# Patient Record
Sex: Female | Born: 1955 | Race: White | Hispanic: No | Marital: Married | Smoking: Never smoker
Health system: Southern US, Community
[De-identification: ages and names within clinical notes are randomized; demographics above are authoritative.]

## PROBLEM LIST (undated history)

## (undated) DIAGNOSIS — F32A Depression, unspecified: Secondary | ICD-10-CM

## (undated) DIAGNOSIS — F419 Anxiety disorder, unspecified: Secondary | ICD-10-CM

## (undated) DIAGNOSIS — T7840XA Allergy, unspecified, initial encounter: Secondary | ICD-10-CM

## (undated) DIAGNOSIS — E785 Hyperlipidemia, unspecified: Secondary | ICD-10-CM

## (undated) DIAGNOSIS — K219 Gastro-esophageal reflux disease without esophagitis: Secondary | ICD-10-CM

## (undated) DIAGNOSIS — I10 Essential (primary) hypertension: Secondary | ICD-10-CM

## (undated) HISTORY — DX: Essential (primary) hypertension: I10

## (undated) HISTORY — DX: Hyperlipidemia, unspecified: E78.5

## (undated) HISTORY — DX: Allergy, unspecified, initial encounter: T78.40XA

## (undated) HISTORY — DX: Gastro-esophageal reflux disease without esophagitis: K21.9

## (undated) HISTORY — DX: Depression, unspecified: F32.A

## (undated) HISTORY — DX: Anxiety disorder, unspecified: F41.9

## (undated) HISTORY — PX: OTHER SURGICAL HISTORY: SHX169

---

## 2001-06-09 ENCOUNTER — Other Ambulatory Visit: Admission: RE | Admit: 2001-06-09 | Discharge: 2001-06-09 | Payer: Self-pay | Admitting: General Surgery

## 2001-06-10 ENCOUNTER — Encounter: Payer: Self-pay | Admitting: General Surgery

## 2001-06-10 ENCOUNTER — Ambulatory Visit (HOSPITAL_COMMUNITY): Admission: RE | Admit: 2001-06-10 | Discharge: 2001-06-10 | Payer: Self-pay | Admitting: General Surgery

## 2002-09-17 ENCOUNTER — Encounter: Payer: Self-pay | Admitting: General Surgery

## 2002-09-17 ENCOUNTER — Ambulatory Visit (HOSPITAL_COMMUNITY): Admission: RE | Admit: 2002-09-17 | Discharge: 2002-09-17 | Payer: Self-pay | Admitting: General Surgery

## 2005-04-10 ENCOUNTER — Ambulatory Visit (HOSPITAL_COMMUNITY): Admission: RE | Admit: 2005-04-10 | Discharge: 2005-04-10 | Payer: Self-pay | Admitting: Family Medicine

## 2007-09-10 ENCOUNTER — Ambulatory Visit (HOSPITAL_COMMUNITY): Admission: RE | Admit: 2007-09-10 | Discharge: 2007-09-10 | Payer: Self-pay | Admitting: Family Medicine

## 2008-07-12 ENCOUNTER — Ambulatory Visit (HOSPITAL_COMMUNITY): Admission: RE | Admit: 2008-07-12 | Discharge: 2008-07-12 | Payer: Self-pay | Admitting: Family Medicine

## 2009-03-01 ENCOUNTER — Encounter: Admission: RE | Admit: 2009-03-01 | Discharge: 2009-03-01 | Payer: Self-pay | Admitting: Obstetrics and Gynecology

## 2009-06-15 ENCOUNTER — Ambulatory Visit (HOSPITAL_BASED_OUTPATIENT_CLINIC_OR_DEPARTMENT_OTHER): Admission: RE | Admit: 2009-06-15 | Discharge: 2009-06-15 | Payer: Self-pay | Admitting: Orthopedic Surgery

## 2010-03-19 ENCOUNTER — Encounter: Payer: Self-pay | Admitting: Sports Medicine

## 2010-03-19 ENCOUNTER — Encounter: Payer: Self-pay | Admitting: Family Medicine

## 2010-05-15 LAB — POCT HEMOGLOBIN-HEMACUE: Hemoglobin: 14 g/dL (ref 12.0–15.0)

## 2013-03-09 ENCOUNTER — Encounter: Payer: Self-pay | Admitting: *Deleted

## 2013-03-31 ENCOUNTER — Ambulatory Visit: Payer: Self-pay | Admitting: General Surgery

## 2013-04-06 ENCOUNTER — Telehealth: Payer: Self-pay

## 2013-04-06 NOTE — Telephone Encounter (Signed)
Gastroenterology Pre-Procedure Review  Request Date:  Requesting Physician:   PATIENT REVIEW QUESTIONS: The patient responded to the following health history questions as indicated:    1. Diabetes Melitis: No   2. Joint replacements in the past 12 months: No 3. Major health problems in the past 3 months: No 4. Has an artificial valve or MVP: No 5. Has a defibrillator: No 6. Has been advised in past to take antibiotics in advance of a procedure like teeth cleaning: No 7.Family History Colon Cancer: No 8.Smoking/Alcohol: No    MEDICATIONS & ALLERGIES:    Patient reports the following regarding taking any blood thinners:   Plavix? No Aspirin? No Coumadin? No  Patient confirms/reports the following medications:  Current Outpatient Prescriptions  Medication Sig Dispense Refill  . cetirizine (ZYRTEC) 5 MG tablet Take 5 mg by mouth daily as needed for allergies.      . naproxen sodium (ANAPROX) 220 MG tablet Take 220 mg by mouth as needed.      Marland Kitchen. omeprazole (PRILOSEC OTC) 20 MG tablet Take 20 mg by mouth daily.       No current facility-administered medications for this visit.    Patient confirms/reports the following allergies:  Allergies  Allergen Reactions  . Tape Rash    Needs to use paper tape    No orders of the defined types were placed in this encounter.    AUTHORIZATION INFORMATION Primary Insurance[facility] [place],  [place] #: [id number],  Group #:[id number] Pre-Cert / Berkley HarveyAuth required:  Pre-Cert / Auth #:    SCHEDULE INFORMATION: Procedure has been scheduled as follows:  Date: , Time:  Location:   This Gastroenterology Pre-Precedure Review Form is being routed to the following provider(s):

## 2013-04-08 ENCOUNTER — Encounter: Payer: Self-pay | Admitting: *Deleted

## 2013-04-08 NOTE — Telephone Encounter (Signed)
LMOM for pt to call to get appt for the colonoscopy.

## 2013-04-15 ENCOUNTER — Encounter (HOSPITAL_COMMUNITY): Payer: Self-pay | Admitting: Pharmacy Technician

## 2013-04-15 ENCOUNTER — Other Ambulatory Visit: Payer: Self-pay

## 2013-04-15 DIAGNOSIS — Z1211 Encounter for screening for malignant neoplasm of colon: Secondary | ICD-10-CM

## 2013-04-15 NOTE — Telephone Encounter (Signed)
I called pt and she is scheduled for colonoscopy with Dr. Darrick PennaFields on 04/30/2013 at 1:15 PM. She uses [facility] in [place] on [address]

## 2013-04-15 NOTE — Telephone Encounter (Signed)
PT MAY HAVE A FULL LIQUID DIET THE DAY BEFORE. PREPOPIK-DRINK WATER TO KEEP URINE LIGHT YELLOW.  PT SHOULD DROP OFF RX 3 DAYS PRIOR TO PROCEDURE.

## 2013-04-16 MED ORDER — SOD PICOSULFATE-MAG OX-CIT ACD 10-3.5-12 MG-GM-GM PO PACK: 1.0000 | PACK | ORAL | Status: DC

## 2013-04-16 NOTE — Telephone Encounter (Signed)
Rx sent to the pharmacy and instructions mailed to pt.  

## 2013-04-19 ENCOUNTER — Telehealth: Payer: Self-pay

## 2013-04-19 NOTE — Telephone Encounter (Signed)
Pt called to reschedule her colonoscopy. She is now rescheduled for 05/03/2013 at 10:45 AM and will be at the hospital at 9:45 AM.  She said she previously had gotten a sample prep from another office in [place], and decided not to have procedure done there The date is good through sometime in 2016. She will call me tomorrow with the name of it so I can send her new instructions, the prep we sent was too expensive.   Beth BattenKim is aware of the new appt.

## 2013-04-23 NOTE — Telephone Encounter (Signed)
Called and reminded pt to send me the name of the prep that she has so I can send her instructions. She is at work and said she will do so on Mon.

## 2013-04-26 NOTE — Telephone Encounter (Signed)
REVIEWED.  

## 2013-04-26 NOTE — Telephone Encounter (Signed)
Pt left VM that she has the Su prep. Just needs instructions for it.

## 2013-04-26 NOTE — Telephone Encounter (Signed)
Instructions for the Suprep mailed to pt.

## 2013-04-27 ENCOUNTER — Ambulatory Visit: Payer: Self-pay

## 2013-04-27 NOTE — Telephone Encounter (Signed)
Pt called to reschedule her colonoscopy that is scheduled for 05/03/2013. 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   She left me some dates on VM and I have rescheduled her for 05/31/2013 at 8:30 AM with Dr. Darrick PennaFields and Selena BattenKim is aware.   Pt is aware I will mail her new instructions with date and time.

## 2013-04-29 NOTE — Telephone Encounter (Signed)
INSTRUCT PT TO TAKE FIRST DOSE AT 530PM ON DAY BEFORE TCS. FOLLOWED BY 32 OZ OF LIQUID. TAKE SECOND DOSE AT 11PM. FOLLOWED BY 32 OZ OF FLUID.

## 2013-05-04 NOTE — Telephone Encounter (Signed)
New instructions mailed to pt with new date and time for colonoscopy.

## 2013-05-17 NOTE — Telephone Encounter (Signed)
Pt called and rescheduled her colonoscopy for 05/31/2013 to 06/21/2013 at 8:30 AM with Dr. Darrick PennaFields. I called Kim and LMOM and asked her to call and confirmed that she received the change.

## 2013-06-18 ENCOUNTER — Telehealth: Payer: Self-pay

## 2013-06-18 NOTE — Telephone Encounter (Signed)
Pt called to cancel her TCS of Monday with [facility]. She will call back to reschedule sometime in June

## 2013-06-21 ENCOUNTER — Ambulatory Visit (HOSPITAL_COMMUNITY)
Admission: RE | Admit: 2013-06-21 | Payer: BC Managed Care – PPO | Source: Ambulatory Visit | Admitting: Gastroenterology

## 2013-06-21 ENCOUNTER — Encounter (HOSPITAL_COMMUNITY): Admission: RE | Payer: Self-pay | Source: Ambulatory Visit

## 2013-06-21 SURGERY — COLONOSCOPY
Anesthesia: Moderate Sedation

## 2014-09-16 IMAGING — US US BREAST*L* LIMITED INC AXILLA
1 series · 3 of 3 positions shown · non-contrast
Comparison: Prior mammograms 10/17/2010, 03/01/2009 and prior
bilateral breast ultrasound 10/17/2010 [REDACTED].

CLINICAL DATA: Prior outside benign biopsy of the left breast,
known bilateral breast cysts, presenting for follow-up after the
biopsy. Patient had an outside ultrasound in September 2010,
describing a 2 mm shadowing mass in the left breast, and this is
presumably what was biopsied.

EXAM:
DIGITAL DIAGNOSTIC  BILATERAL MAMMOGRAM WITH CAD
ULTRASOUND LEFT BREAST

[Series 1: us breast*left* limited inc axilla · 0.08mm/px · 3 of 3 slices shown]
[im 1/3]
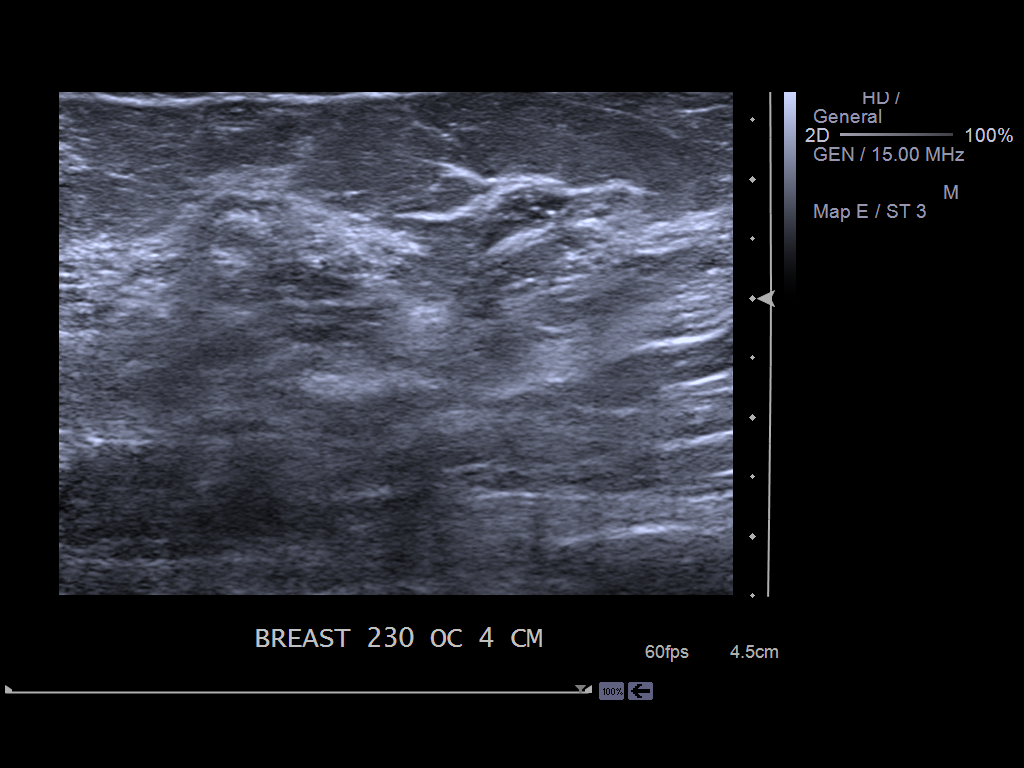
[im 2/3]
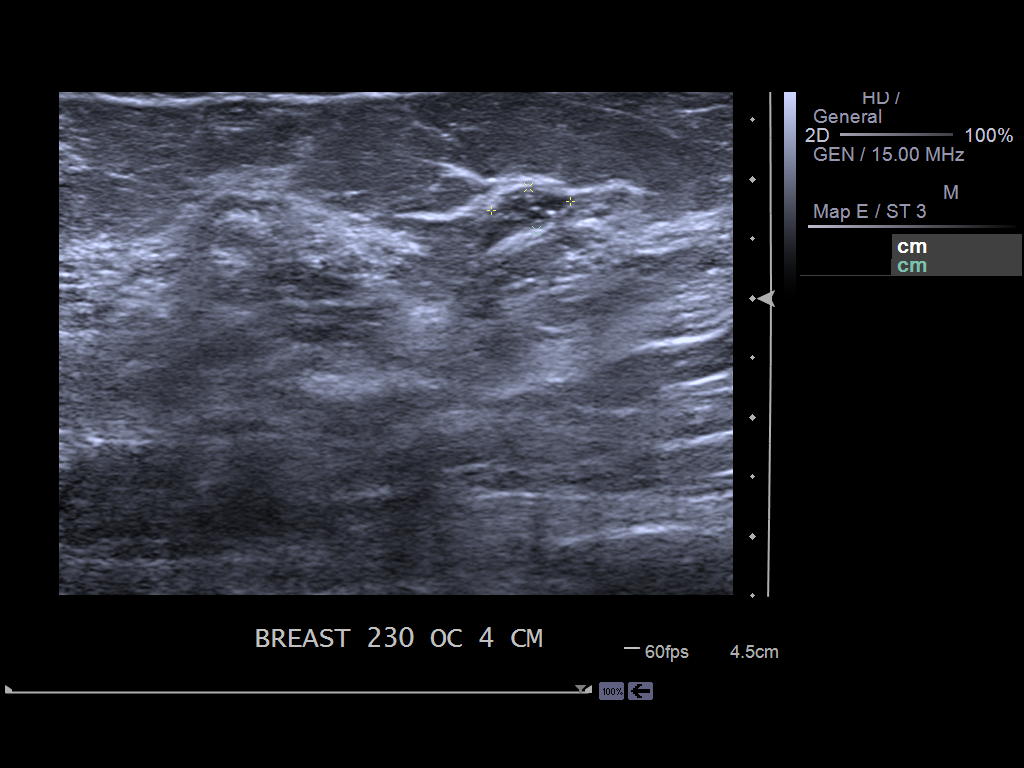
[im 3/3]
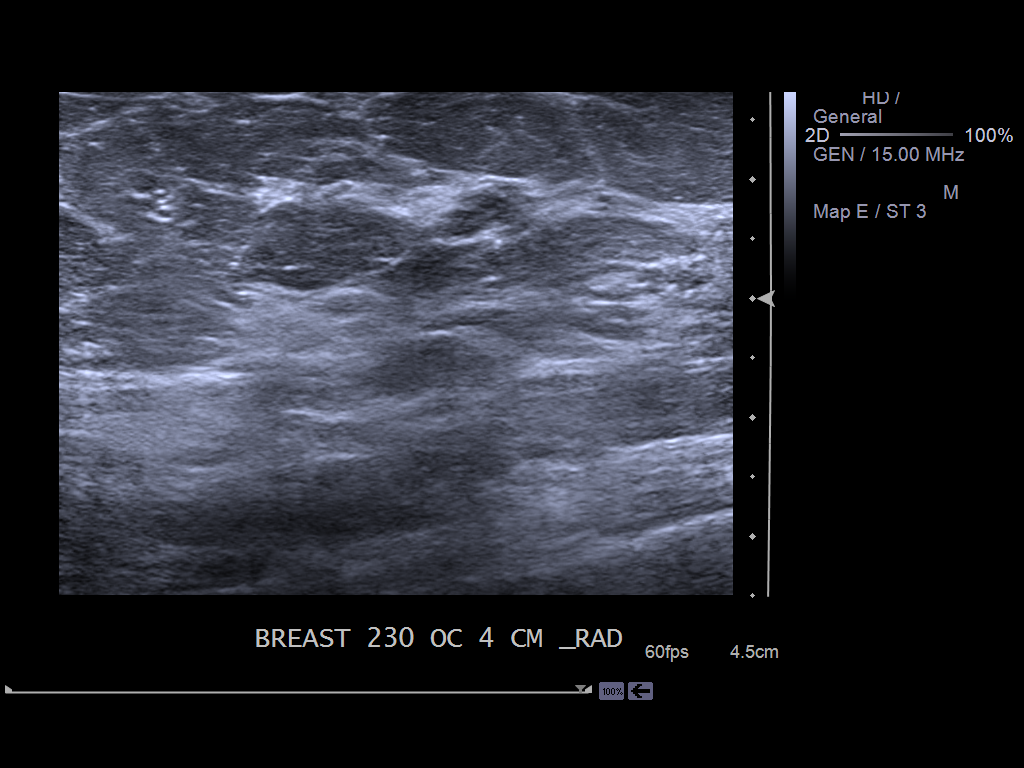

[3 of 3 positions shown; findings below may reference images not displayed]

ACR Breast Density Category c: The breast tissue is heterogeneously
dense, which may obscure small masses.
FINDINGS: CC and MLO views of both breasts were obtained. Multiple bilateral
circumscribed, partially obscured masses as noted on the prior
examination, shown on the prior ultrasound to represent cysts. No
findings suspicious for malignancy in either breast.

Mammographic images were processed with CAD.

On physical exam, there is no palpable abnormality in the
periareolar left breast.

Ultrasound is performed, showing a cluster of small cysts or focus
of apocrine metaplasia at the 2:30 o'clock position of the left
breast approximately 4 cm from the nipple measuring approximately
0.6 x 0.4 x 0.7 cm. The previously identified possible shadowing 2
mm lesion was not identified. No suspicious solid mass or abnormal
acoustic shadowing was identified.
IMPRESSION: 1. Likely benign focus of apocrine metaplasia or cluster of small
cysts in the outer periareolar left breast.
2. No mammographic or sonographic evidence of malignancy, left
breast.
3. No mammographic evidence of malignancy, right breast.

RECOMMENDATION:
Screening mammogram in one year.(Code:8K-G-NNG)

The importance of self breast examination was discussed with the
patient. I have discussed the findings and recommendations with the
patient. Results were also provided in writing at the conclusion of
the visit. If applicable, a reminder letter will be sent to the
patient regarding the next appointment.

BI-RADS CATEGORY  2: Benign finding(s).

## 2015-07-03 ENCOUNTER — Ambulatory Visit: Payer: Self-pay | Admitting: Unknown Physician Specialty

## 2015-08-15 ENCOUNTER — Encounter: Payer: Self-pay | Admitting: Unknown Physician Specialty

## 2015-08-15 ENCOUNTER — Ambulatory Visit (INDEPENDENT_AMBULATORY_CARE_PROVIDER_SITE_OTHER): Payer: Managed Care, Other (non HMO) | Admitting: Unknown Physician Specialty

## 2015-08-15 VITALS — BP 136/80 | HR 82 | Temp 97.9°F | Ht 59.0 in | Wt 171.8 lb

## 2015-08-15 DIAGNOSIS — M159 Polyosteoarthritis, unspecified: Secondary | ICD-10-CM

## 2015-08-15 DIAGNOSIS — E669 Obesity, unspecified: Secondary | ICD-10-CM | POA: Insufficient documentation

## 2015-08-15 DIAGNOSIS — N952 Postmenopausal atrophic vaginitis: Secondary | ICD-10-CM | POA: Diagnosis not present

## 2015-08-15 DIAGNOSIS — Z0000 Encounter for general adult medical examination without abnormal findings: Secondary | ICD-10-CM

## 2015-08-15 DIAGNOSIS — Z23 Encounter for immunization: Secondary | ICD-10-CM | POA: Diagnosis not present

## 2015-08-15 DIAGNOSIS — J309 Allergic rhinitis, unspecified: Secondary | ICD-10-CM

## 2015-08-15 DIAGNOSIS — E663 Overweight: Secondary | ICD-10-CM | POA: Diagnosis not present

## 2015-08-15 DIAGNOSIS — K219 Gastro-esophageal reflux disease without esophagitis: Secondary | ICD-10-CM

## 2015-08-15 NOTE — Assessment & Plan Note (Addendum)
Discussed diet and exercise.  Would like to start 30 minutes 4 days/week

## 2015-08-15 NOTE — Progress Notes (Signed)
BP 136/80 mmHg  Pulse 82  Temp(Src) 97.9 F (36.6 C)  Ht  (1.499 m)  Wt 171 lb 12.8 oz (77.928 kg)  BMI 34.68 kg/m2  SpO2 99%  LMP  (LMP Unknown)   Subjective:    Patient ID: Beth James, female    DOB: 25-Aug-1955, 60 y.o.   MRN: [id number]  HPI: Beth James is a 60 y.o. female  Chief Complaint  Patient presents with  . Establish Care    pt states she has never had colonoscopy, will get pap with [place] at physical, and wants to wait for tetanus and shingles vaccines   OA Thumbs, shoulders and knee.  Takes 2 Aleve at night  GERD Controlled with Omeprazole  Allergic Rhinitis Eyes water and nose running.  Allegra seems to help  Relevant past medical, surgical, family and social history reviewed and updated as indicated. Interim medical history since our last visit reviewed. Allergies and medications reviewed and updated.  Review of Systems  Constitutional: Negative.   HENT: Negative.   Eyes: Negative.   Respiratory: Negative.   Cardiovascular: Negative.   Gastrointestinal: Negative.   Endocrine: Negative.   Genitourinary: Negative.   Musculoskeletal: Negative.   Skin: Negative.   Allergic/Immunologic: Negative.   Neurological: Negative.   Hematological: Negative.   Psychiatric/Behavioral: Negative.     Per HPI unless specifically indicated above     Objective:    BP 136/80 mmHg  Pulse 82  Temp(Src) 97.9 F (36.6 C)  Ht  (1.499 m)  Wt 171 lb 12.8 oz (77.928 kg)  BMI 34.68 kg/m2  SpO2 99%  LMP  (LMP Unknown)  Wt Readings from Last 3 Encounters:  08/15/15 171 lb 12.8 oz (77.928 kg)    Physical Exam  Constitutional: She is oriented to person, place, and time. She appears well-developed and well-nourished.  HENT:  Head: Normocephalic and atraumatic.  Eyes: Pupils are equal, round, and reactive to light. Right eye exhibits no discharge. Left eye exhibits no discharge. No scleral icterus.  Neck: Normal range of motion. Neck supple.  Carotid bruit is not present. No thyromegaly present.  Cardiovascular: Normal rate, regular rhythm and normal heart sounds.  Exam reveals no gallop and no friction rub.   No murmur heard. Pulmonary/Chest: Effort normal and breath sounds normal. No respiratory distress. She has no wheezes. She has no rales.  Abdominal: Soft. Bowel sounds are normal. There is no tenderness. There is no rebound.  Genitourinary: Vagina normal and uterus normal. No breast swelling, tenderness or discharge. Cervix exhibits no motion tenderness, no discharge and no friability. Right adnexum displays no mass, no tenderness and no fullness. Left adnexum displays no mass, no tenderness and no fullness.  Musculoskeletal: Normal range of motion.  Lymphadenopathy:    She has no cervical adenopathy.  Neurological: She is alert and oriented to person, place, and time.  Skin: Skin is warm, dry and intact. No rash noted.  Psychiatric: She has a normal mood and affect. Her speech is normal and behavior is normal. Judgment and thought content normal. Cognition and memory are normal.    Results for orders placed or performed during the hospital encounter of 06/15/09  Hemoglobin-hemacue, POC  Result Value Ref Range   Hemoglobin 14.0 12.0 - 15.0 g/dL      Assessment & Plan:   Problem List Items Addressed This Visit      Unprioritized   Atrophic vaginitis    Non-hormonal treatment discussed      Gastroesophageal  reflux disease without esophagitis   Generalized OA   Overweight    Discussed diet and exercise.  Would like to start 30 minutes 4 days/week      Rhinitis, allergic    Other Visit Diagnoses    Routine general medical examination at a health care facility    -  Primary    Relevant Orders    Tdap vaccine greater than or equal to 7yo IM (Completed)    MM DIGITAL SCREENING BILATERAL    Comprehensive metabolic panel    CBC with Differential/Platelet    Lipid Panel w/o Chol/HDL Ratio    TSH    IGP, Aptima  HPV, rfx 16/18,45    Cologuard        Follow up plan: Return if symptoms worsen or fail to improve.

## 2015-08-15 NOTE — Patient Instructions (Addendum)
Vaginal dryness Replense/with coconut oil Astroglide  Please do call to schedule your mammogram; the number to schedule one at either [facility] or [facility] is [phone]   Tdap Vaccine (Tetanus, Diphtheria and Pertussis): What You Need to Know 1. Why get vaccinated? Tetanus, diphtheria and pertussis are very serious diseases. Tdap vaccine can protect us from these diseases. And, Tdap vaccine given to pregnant women can protect newborn babies against pertussis. TETANUS (Lockjaw) is rare in the [place] today. It causes painful muscle tightening and stiffness, usually all over the body.  It can lead to tightening of muscles in the head and neck so you can't open your mouth, swallow, or sometimes even breathe. Tetanus kills about 1 out of 10 people who are infected even after receiving the best medical care. DIPHTHERIA is also rare in the [place] today. It can cause a thick coating to form in the back of the throat.  It can lead to breathing problems, heart failure, paralysis, and death. PERTUSSIS (Whooping Cough) causes severe coughing spells, which can cause difficulty breathing, vomiting and disturbed sleep.  It can also lead to weight loss, incontinence, and rib fractures. Up to 2 in 100 adolescents and 5 in 100 adults with pertussis are hospitalized or have complications, which could include pneumonia or death. These diseases are caused by bacteria. Diphtheria and pertussis are spread from person to person through secretions from coughing or sneezing. Tetanus enters the body through cuts, scratches, or wounds. Before vaccines, as many as 200,000 cases of diphtheria, 200,000 cases of pertussis, and hundreds of cases of tetanus, were reported in the [place] each year. Since vaccination began, reports of cases for tetanus and diphtheria have dropped by about 99% and for pertussis by about 80%. 2. Tdap vaccine Tdap vaccine can protect  adolescents and adults from tetanus, diphtheria, and pertussis. One dose of Tdap is routinely given at age 60 or 5912. People who did not get Tdap at that age should get it as soon as possible. Tdap is especially important for healthcare professionals and anyone having close contact with a baby younger than 12 months. Pregnant women should get a dose of Tdap during every pregnancy, to protect the newborn from pertussis. Infants are most at risk for severe, life-threatening complications from pertussis. Another vaccine, called Td, protects against tetanus and diphtheria, but not pertussis. A Td booster should be given every 10 years. Tdap may be given as one of these boosters if you have never gotten Tdap before. Tdap may also be given after a severe cut or burn to prevent tetanus infection. Your doctor or the person giving you the vaccine can give you more information. Tdap may safely be given at the same time as other vaccines. 3. Some people should not get this vaccine  A person who has ever had a life-threatening allergic reaction after a previous dose of any diphtheria, tetanus or pertussis containing vaccine, OR has a severe allergy to any part of this vaccine, should not get Tdap vaccine. Tell the person giving the vaccine about any severe allergies.  Anyone who had coma or long repeated seizures within 7 days after a childhood dose of DTP or DTaP, or a previous dose of Tdap, should not get Tdap, unless a cause other than the vaccine was found. They can still get Td.  Talk to your doctor if you:  have seizures or another nervous system problem,  had severe pain or swelling after any vaccine containing  diphtheria, tetanus or pertussis,  ever had a condition called Guillain-Barr Syndrome (GBS),  aren't feeling well on the day the shot is scheduled. 4. Risks With any medicine, including vaccines, there is a chance of side effects. These are usually mild and go away on their own. Serious  reactions are also possible but are rare. Most people who get Tdap vaccine do not have any problems with it. Mild problems following Tdap (Did not interfere with activities)  Pain where the shot was given (about 3 in 4 adolescents or 2 in 3 adults)  Redness or swelling where the shot was given (about 1 person in 5)  Mild fever of at least 100.93F (up to about 1 in 25 adolescents or 1 in 100 adults)  Headache (about 3 or 4 people in 10)  Tiredness (about 1 person in 3 or 4)  Nausea, vomiting, diarrhea, stomach ache (up to 1 in 4 adolescents or 1 in 10 adults)  Chills, sore joints (about 1 person in 10)  Body aches (about 1 person in 3 or 4)  Rash, swollen glands (uncommon) Moderate problems following Tdap (Interfered with activities, but did not require medical attention)  Pain where the shot was given (up to 1 in 5 or 6)  Redness or swelling where the shot was given (up to about 1 in 16 adolescents or 1 in 12 adults)  Fever over 102F (about 1 in 100 adolescents or 1 in 250 adults)  Headache (about 1 in 7 adolescents or 1 in 10 adults)  Nausea, vomiting, diarrhea, stomach ache (up to 1 or 3 people in 100)  Swelling of the entire arm where the shot was given (up to about 1 in 500). Severe problems following Tdap (Unable to perform usual activities; required medical attention)  Swelling, severe pain, bleeding and redness in the arm where the shot was given (rare). Problems that could happen after any vaccine:  People sometimes faint after a medical procedure, including vaccination. Sitting or lying down for about 15 minutes can help prevent fainting, and injuries caused by a fall. Tell your doctor if you feel dizzy, or have vision changes or ringing in the ears.  Some people get severe pain in the shoulder and have difficulty moving the arm where a shot was given. This happens very rarely.  Any medication can cause a severe allergic reaction. Such reactions from a vaccine  are very rare, estimated at fewer than 1 in a million doses, and would happen within a few minutes to a few hours after the vaccination. As with any medicine, there is a very remote chance of a vaccine causing a serious injury or death. The safety of vaccines is always being monitored. For more information, visit: http://floyd.org/ 5. What if there is a serious problem? What should I look for?  Look for anything that concerns you, such as signs of a severe allergic reaction, very high fever, or unusual behavior.  Signs of a severe allergic reaction can include hives, swelling of the face and throat, difficulty breathing, a fast heartbeat, dizziness, and weakness. These would usually start a few minutes to a few hours after the vaccination. What should I do?  If you think it is a severe allergic reaction or other emergency that can't wait, call 9-1-1 or get the person to the nearest hospital. Otherwise, call your doctor.  Afterward, the reaction should be reported to the Vaccine Adverse Event Reporting System (VAERS). Your doctor might file this report, or you can do  it yourself through the VAERS web site at www.vaers.LAgents.no, or by calling 1-931-509-6414. VAERS does not give medical advice.  6. The National Vaccine Injury Compensation Program The [employer] Vaccine Injury Compensation Program (VICP) is a federal program that was created to compensate people who may have been injured by certain vaccines. Persons who believe they may have been injured by a vaccine can learn about the program and about filing a claim by calling 1-(661) 557-7590 or visiting the VICP website at SpiritualWord.at. There is a time limit to file a claim for compensation. 7. How can I learn more?  Ask your doctor. He or she can give you the vaccine package insert or suggest other sources of information.  Call your local or state health department.  Contact the Centers for Disease Control and  Prevention (CDC):  Call 856 413 8794 (1-800-CDC-INFO) or  Visit CDC's website at PicCapture.uy CDC Tdap Vaccine VIS (04/20/13)   This information is not intended to replace advice given to you by your health care provider. Make sure you discuss any questions you have with your health care provider.   Document Released: 08/13/2011 Document Revised: 03/04/2014 Document Reviewed: 05/26/2013 Elsevier Interactive Patient Education [employer]

## 2015-08-15 NOTE — Assessment & Plan Note (Signed)
Non-hormonal treatment discussed

## 2015-08-16 ENCOUNTER — Encounter: Payer: Self-pay | Admitting: Unknown Physician Specialty

## 2015-08-16 LAB — CBC WITH DIFFERENTIAL/PLATELET
BASOS ABS: 0 10*3/uL (ref 0.0–0.2)
Basos: 1 %
EOS (ABSOLUTE): 0.1 10*3/uL (ref 0.0–0.4)
Eos: 2 %
HEMOGLOBIN: 13.6 g/dL (ref 11.1–15.9)
Hematocrit: 43 % (ref 34.0–46.6)
Immature Grans (Abs): 0 10*3/uL (ref 0.0–0.1)
Immature Granulocytes: 0 %
LYMPHS ABS: 2.7 10*3/uL (ref 0.7–3.1)
Lymphs: 37 %
MCH: 28.5 pg (ref 26.6–33.0)
MCHC: 31.6 g/dL (ref 31.5–35.7)
MCV: 90 fL (ref 79–97)
MONOCYTES: 8 %
MONOS ABS: 0.6 10*3/uL (ref 0.1–0.9)
NEUTROS ABS: 3.9 10*3/uL (ref 1.4–7.0)
Neutrophils: 52 %
Platelets: 365 10*3/uL (ref 150–379)
RBC: 4.78 x10E6/uL (ref 3.77–5.28)
RDW: 13.7 % (ref 12.3–15.4)
WBC: 7.3 10*3/uL (ref 3.4–10.8)

## 2015-08-16 LAB — COMPREHENSIVE METABOLIC PANEL
ALT: 12 IU/L (ref 0–32)
AST: 11 IU/L (ref 0–40)
Albumin/Globulin Ratio: 1.9 (ref 1.2–2.2)
Albumin: 4.5 g/dL (ref 3.6–4.8)
Alkaline Phosphatase: 90 IU/L (ref 39–117)
BUN/Creatinine Ratio: 26 (ref 12–28)
BUN: 18 mg/dL (ref 8–27)
Bilirubin Total: 0.2 mg/dL (ref 0.0–1.2)
CO2: 24 mmol/L (ref 18–29)
Calcium: 9.6 mg/dL (ref 8.7–10.3)
Chloride: 103 mmol/L (ref 96–106)
Creatinine, Ser: 0.68 mg/dL (ref 0.57–1.00)
GFR calc Af Amer: 110 mL/min/{1.73_m2} (ref >59)
GFR calc non Af Amer: 95 mL/min/{1.73_m2} (ref >59)
Globulin, Total: 2.4 g/dL (ref 1.5–4.5)
Glucose: 102 mg/dL — ABNORMAL HIGH (ref 65–99)
Potassium: 4.8 mmol/L (ref 3.5–5.2)
Sodium: 144 mmol/L (ref 134–144)
Total Protein: 6.9 g/dL (ref 6.0–8.5)

## 2015-08-16 LAB — LIPID PANEL W/O CHOL/HDL RATIO
CHOLESTEROL TOTAL: 218 mg/dL — AB (ref 100–199)
HDL: 47 mg/dL (ref >39)
LDL CALC: 121 mg/dL — AB (ref 0–99)
Triglycerides: 252 mg/dL — ABNORMAL HIGH (ref 0–149)
VLDL Cholesterol Cal: 50 mg/dL — ABNORMAL HIGH (ref 5–40)

## 2015-08-16 LAB — TSH: TSH: 1.88 u[IU]/mL (ref 0.450–4.500)

## 2015-08-17 LAB — IGP, APTIMA HPV, RFX 16/18,45
HPV Aptima: NEGATIVE
PAP SMEAR COMMENT: 0

## 2016-04-17 ENCOUNTER — Ambulatory Visit (INDEPENDENT_AMBULATORY_CARE_PROVIDER_SITE_OTHER): Payer: Managed Care, Other (non HMO) | Admitting: Family Medicine

## 2016-04-17 ENCOUNTER — Encounter: Payer: Self-pay | Admitting: Family Medicine

## 2016-04-17 VITALS — BP 122/64 | HR 106 | Temp 97.8°F | Wt 173.9 lb

## 2016-04-17 DIAGNOSIS — J029 Acute pharyngitis, unspecified: Secondary | ICD-10-CM

## 2016-04-17 DIAGNOSIS — M25512 Pain in left shoulder: Secondary | ICD-10-CM

## 2016-04-17 DIAGNOSIS — M25511 Pain in right shoulder: Secondary | ICD-10-CM

## 2016-04-17 MED ORDER — CYCLOBENZAPRINE HCL 10 MG PO TABS: 10.0000 mg | ORAL_TABLET | Freq: Every day | ORAL | 0 refills | Status: DC

## 2016-04-17 MED ORDER — PREDNISONE 20 MG PO TABS: 40.0000 mg | ORAL_TABLET | Freq: Every day | ORAL | 0 refills | Status: DC

## 2016-04-17 NOTE — Progress Notes (Signed)
BP 122/64 (BP Location: Left Arm, Patient Position: Sitting, Cuff Size: Normal)   Pulse (!) 106   Temp 97.8 F (36.6 C)   Wt 173 lb 14.4 oz (78.9 kg)   LMP  (LMP Unknown)   SpO2 97%   BMI 35.12 kg/m    Subjective:    Patient ID: Beth James, female    DOB: 05/15/1955, 61 y.o.   MRN: [id number]  HPI: Beth Carryamela L Barkey is a 61 y.o. female  Chief Complaint  Patient presents with  . Shoulder Pain    Bilateral. Duration a couple of months.   . Throat Problem    Patient feels like she has something stuck in the back of her throat.    Patient with several months of b/l shoulder soreness. Some decreased ROM in left shoulder. Denies fever, HA, numbness, tingling, weakness. No known injury, but is a side sleeper. Hx of OA. Trying tylenol with no relief.   Also c/o feeling like something is swollen in the back of her throat for a week or two. Recently recovered from sinus issues and this is the only lingering symptom. Wondering if it's from post-nasal drip.   No past medical history on file.  Social History   Social History  . Marital status: Married    Spouse name: N/A  . Number of children: N/A  . Years of education: N/A   Occupational History  . Not on file.   Social History Main Topics  . Smoking status: Never Smoker  . Smokeless tobacco: Never Used  . Alcohol use No  . Drug use: No  . Sexual activity: Yes   Other Topics Concern  . Not on file   Social History Narrative  . No narrative on file    Relevant past medical, surgical, family and social history reviewed and updated as indicated. Interim medical history since our last visit reviewed. Allergies and medications reviewed and updated.  Review of Systems  Constitutional: Negative for chills and fever.  HENT: Positive for sore throat. Negative for trouble swallowing.   Eyes: Negative.   Respiratory: Negative.   Cardiovascular: Negative.   Gastrointestinal: Negative.   Genitourinary: Negative.     Musculoskeletal: Positive for arthralgias and myalgias.  Neurological: Negative.   Psychiatric/Behavioral: Negative.     Per HPI unless specifically indicated above     Objective:    BP 122/64 (BP Location: Left Arm, Patient Position: Sitting, Cuff Size: Normal)   Pulse (!) 106   Temp 97.8 F (36.6 C)   Wt 173 lb 14.4 oz (78.9 kg)   LMP  (LMP Unknown)   SpO2 97%   BMI 35.12 kg/m   Wt Readings from Last 3 Encounters:  04/17/16 173 lb 14.4 oz (78.9 kg)  08/15/15 171 lb 12.8 oz (77.9 kg)    Physical Exam  Constitutional: She is oriented to person, place, and time. She appears well-developed and well-nourished. No distress.  HENT:  Head: Atraumatic.  Right Ear: External ear normal.  Left Ear: External ear normal.  Nose: Nose normal.  Posterior oropharynx erythematous  Eyes: Conjunctivae are normal. Pupils are equal, round, and reactive to light.  Neck: Normal range of motion. Neck supple.  Cardiovascular: Normal rate and normal heart sounds.   Pulmonary/Chest: Effort normal and breath sounds normal. No respiratory distress.  Musculoskeletal: She exhibits tenderness (Mild ttp over deltoids b/l). She exhibits no edema or deformity.  Good ROM in b/l shoulders. Some crepitus b/l. Pain with overhead extension of left shoulder  Lymphadenopathy:    She has cervical adenopathy (mild).  Neurological: She is alert and oriented to person, place, and time.  Skin: Skin is warm and dry. No rash noted. No erythema.  Psychiatric: She has a normal mood and affect. Her behavior is normal.  Nursing note and vitals reviewed.     Assessment & Plan:   Problem List Items Addressed This Visit    None    Visit Diagnoses    Acute pain of both shoulders    -  Primary   Pt declines labs today, would like to try prednisone and flexeril QHS first. Discussed gentle stretches, strengthening exercises, tylenol prn.    Throat soreness       Pt declines throat culture, will continue prilosec and  increase flonase to twice daily. Monitor closely and f/u with worsening sxs or no improvement       Follow up plan: Return for Physical Exam.

## 2016-04-19 NOTE — Patient Instructions (Signed)
Follow up as needed

## 2016-09-03 ENCOUNTER — Ambulatory Visit (INDEPENDENT_AMBULATORY_CARE_PROVIDER_SITE_OTHER): Payer: Managed Care, Other (non HMO) | Admitting: Family Medicine

## 2016-09-03 ENCOUNTER — Encounter: Payer: Self-pay | Admitting: Family Medicine

## 2016-09-03 VITALS — BP 138/80 | HR 85 | Temp 98.9°F | Ht 58.75 in | Wt 174.0 lb

## 2016-09-03 DIAGNOSIS — Z1231 Encounter for screening mammogram for malignant neoplasm of breast: Secondary | ICD-10-CM | POA: Diagnosis not present

## 2016-09-03 DIAGNOSIS — Z0000 Encounter for general adult medical examination without abnormal findings: Secondary | ICD-10-CM | POA: Diagnosis not present

## 2016-09-03 DIAGNOSIS — Z1211 Encounter for screening for malignant neoplasm of colon: Secondary | ICD-10-CM

## 2016-09-03 DIAGNOSIS — M25512 Pain in left shoulder: Secondary | ICD-10-CM

## 2016-09-03 DIAGNOSIS — G8929 Other chronic pain: Secondary | ICD-10-CM

## 2016-09-03 LAB — UA/M W/RFLX CULTURE, ROUTINE
Bilirubin, UA: NEGATIVE
Glucose, UA: NEGATIVE
KETONES UA: NEGATIVE
NITRITE UA: NEGATIVE
PH UA: 6.5 (ref 5.0–7.5)
Protein, UA: NEGATIVE
SPEC GRAV UA: 1.02 (ref 1.005–1.030)
Urobilinogen, Ur: 0.2 mg/dL (ref 0.2–1.0)

## 2016-09-03 LAB — MICROSCOPIC EXAMINATION
BACTERIA UA: NONE SEEN
RBC MICROSCOPIC, UA: NONE SEEN /HPF (ref 0–?)

## 2016-09-03 NOTE — Progress Notes (Signed)
BP 138/80   Pulse 85   Temp 98.9 F (37.2 C)   Ht 4' 10.75" (1.492 m)   Wt 174 lb (78.9 kg)   LMP  (LMP Unknown)   SpO2 98%   BMI 35.44 kg/m    Subjective:    Patient ID: Beth James, female    DOB: 1955/06/30, 61 y.o.   MRN: [id number]  HPI: Beth James is a 61 y.o. female presenting on 09/03/2016 for comprehensive medical examination. Current medical complaints include:B/l shoulder pain much improved after prednisone and flexeril course back in Feb, but still experiencing decreased ROM in anterior left shoulder. Feels issue is actually slowly worsening despite efforts to do stretches and exercises. Hx of OA, and has had surgical removal of bone spurs on right shoulder in the past.   Patient is not fasting today for labs.   Menopausal Symptoms: no  Depression Screen done today and results listed below:  Depression screen [facility] 2/9 09/03/2016 08/15/2015  Decreased Interest 0 0  Down, Depressed, Hopeless 0 0  PHQ - 2 Score 0 0    The patient does not have a history of falls. I did not complete a risk assessment for falls. A plan of care for falls was not documented.   Past Medical History:  History reviewed. No pertinent past medical history.  Surgical History:  Past Surgical History:  Procedure Laterality Date  . bone spur removal     left shoulder  . cartilage removal     left knee  . Clogged Tear Duct Stint placed      Medications:  Current Outpatient Prescriptions on File Prior to Visit  Medication Sig  . CALCIUM PO Take 2 tablets by mouth daily.  . fluticasone (FLONASE) 50 MCG/ACT nasal spray Place into both nostrils daily.  . naproxen sodium (ALEVE) 220 MG tablet Take 440 mg by mouth at bedtime.  Marland Kitchen omeprazole (PRILOSEC OTC) 20 MG tablet Take 20 mg by mouth daily.   No current facility-administered medications on file prior to visit.     Allergies:  Allergies  Allergen Reactions  . Codeine Other (See Comments)    jittery  . Tape Rash    Needs  to use paper tape    Social History:  Social History   Social History  . Marital status: Married    Spouse name: N/A  . Number of children: N/A  . Years of education: N/A   Occupational History  . Not on file.   Social History Main Topics  . Smoking status: Never Smoker  . Smokeless tobacco: Never Used  . Alcohol use No  . Drug use: No  . Sexual activity: Yes   Other Topics Concern  . Not on file   Social History Narrative  . No narrative on file   History  Smoking Status  . Never Smoker  Smokeless Tobacco  . Never Used   History  Alcohol Use No    Family History:  Family History  Problem Relation Age of Onset  . Heart disease Mother        CHF  . Heart disease Father   . Cancer Father        lung  . Heart disease Maternal Grandmother        CHF  . Cancer Other        colon   Past medical history, surgical history, medications, allergies, family history and social history reviewed with patient today and changes made to appropriate areas  of the chart.   Review of Systems - General ROS: negative Psychological ROS: negative Ophthalmic ROS: negative ENT ROS: negative Allergy and Immunology ROS: negative Endocrine ROS: negative Breast ROS: negative for breast lumps Respiratory ROS: no cough, shortness of breath, or wheezing Cardiovascular ROS: no chest pain or dyspnea on exertion Gastrointestinal ROS: no abdominal pain, change in bowel habits, or black or bloody stools Genito-Urinary ROS: no dysuria, trouble voiding, or hematuria Musculoskeletal ROS: negative Neurological ROS: no TIA or stroke symptoms Dermatological ROS: negative All other ROS negative except what is listed above and in the HPI.      Objective:    BP 138/80   Pulse 85   Temp 98.9 F (37.2 C)   Ht 4' 10.75" (1.492 m)   Wt 174 lb (78.9 kg)   LMP  (LMP Unknown)   SpO2 98%   BMI 35.44 kg/m   Wt Readings from Last 3 Encounters:  09/03/16 174 lb (78.9 kg)  04/17/16 173 lb 14.4  oz (78.9 kg)  08/15/15 171 lb 12.8 oz (77.9 kg)    Physical Exam  Constitutional: She is oriented to person, place, and time. She appears well-developed and well-nourished. No distress.  HENT:  Head: Atraumatic.  Right Ear: External ear normal.  Left Ear: External ear normal.  Nose: Nose normal.  Mouth/Throat: Oropharynx is clear and moist. No oropharyngeal exudate.  Eyes: Conjunctivae are normal. Pupils are equal, round, and reactive to light. No scleral icterus.  Neck: Normal range of motion. Neck supple. No thyromegaly present.  Cardiovascular: Normal rate, regular rhythm, normal heart sounds and intact distal pulses.   Pulmonary/Chest: Effort normal and breath sounds normal. No respiratory distress. Right breast exhibits no mass, no skin change and no tenderness. Left breast exhibits no mass, no skin change and no tenderness.  Abdominal: Soft. Bowel sounds are normal. She exhibits no mass. There is no tenderness.  Musculoskeletal: Normal range of motion. She exhibits no edema or tenderness.  Lymphadenopathy:    She has no cervical adenopathy.    She has no axillary adenopathy.  Neurological: She is alert and oriented to person, place, and time. No cranial nerve deficit.  Skin: Skin is warm and dry. No rash noted.  Psychiatric: She has a normal mood and affect. Her behavior is normal.  Nursing note and vitals reviewed.     Assessment & Plan:   Problem List Items Addressed This Visit    None    Visit Diagnoses    Annual physical exam    -  Primary   Non-fasting labs drawn today. Cologuard and mammogram ordered. Await results   Relevant Orders   CBC with Differential/Platelet   Comprehensive metabolic panel   Lipid Panel w/o Chol/HDL Ratio   TSH   UA/M w/rflx Culture, Routine   HgB A1c   Screening mammogram, encounter for       Relevant Orders   MM Digital Screening   Screening for colon cancer       Relevant Orders   Cologuard   Chronic left shoulder pain       Will  obtain x-ray. Discussed continued ROM exercises and gentle stretches, flexeril prn. May need further eval with orthopedics    Relevant Orders   DG Shoulder Left      Follow up plan: Return in about 1 year (around 09/03/2017) for CPE.  LABORATORY TESTING:  - Pap smear: up to date  IMMUNIZATIONS:   - Tdap: Tetanus vaccination status reviewed: last tetanus booster within  10 years. - Influenza: Postponed to flu season  SCREENING: -Mammogram: Ordered today  - Colonoscopy: cologuard ordered   PATIENT COUNSELING:   Advised to take 1 mg of folate supplement per day if capable of pregnancy.   Sexuality: Discussed sexually transmitted diseases, partner selection, use of condoms, avoidance of unintended pregnancy  and contraceptive alternatives.   Advised to avoid cigarette smoking.  I discussed with the patient that most people either abstain from alcohol or drink within safe limits (<=14/week and <=4 drinks/occasion for males, <=7/weeks and <= 3 drinks/occasion for females) and that the risk for alcohol disorders and other health effects rises proportionally with the number of drinks per week and how often a drinker exceeds daily limits.  Discussed cessation/primary prevention of drug use and availability of treatment for abuse.   Diet: Encouraged to adjust caloric intake to maintain  or achieve ideal body weight, to reduce intake of dietary saturated fat and total fat, to limit sodium intake by avoiding high sodium foods and not adding table salt, and to maintain adequate dietary potassium and calcium preferably from fresh fruits, vegetables, and low-fat dairy products.    stressed the importance of regular exercise  Injury prevention: Discussed safety belts, safety helmets, smoke detector, smoking near bedding or upholstery.   Dental health: Discussed importance of regular tooth brushing, flossing, and dental visits.    NEXT PREVENTATIVE PHYSICAL DUE IN 1 YEAR. Return in about 1 year  (around 09/03/2017) for CPE.

## 2016-09-03 NOTE — Patient Instructions (Addendum)
DASH Eating Plan DASH stands for "Dietary Approaches to Stop Hypertension." The DASH eating plan is a healthy eating plan that has been shown to reduce high blood pressure (hypertension). It may also reduce your risk for type 2 diabetes, heart disease, and stroke. The DASH eating plan may also help with weight loss. What are tips for following this plan? General guidelines  Avoid eating more than 2,300 mg (milligrams) of salt (sodium) a day. If you have hypertension, you may need to reduce your sodium intake to 1,500 mg a day.  Limit alcohol intake to no more than 1 drink a day for nonpregnant women and 2 drinks a day for men. One drink equals 12 oz of beer, 5 oz of wine, or 1 oz of hard liquor.  Work with your health care provider to maintain a healthy body weight or to lose weight. Ask what an ideal weight is for you.  Get at least 30 minutes of exercise that causes your heart to beat faster (aerobic exercise) most days of the week. Activities may include walking, swimming, or biking.  Work with your health care provider or diet and nutrition specialist (dietitian) to adjust your eating plan to your individual calorie needs. Reading food labels  Check food labels for the amount of sodium per serving. Choose foods with less than 5 percent of the Daily Value of sodium. Generally, foods with less than 300 mg of sodium per serving fit into this eating plan.  To find whole grains, look for the word "whole" as the first word in the ingredient list. Shopping  Buy products labeled as "low-sodium" or "no salt added."  Buy fresh foods. Avoid canned foods and premade or frozen meals. Cooking  Avoid adding salt when cooking. Use salt-free seasonings or herbs instead of table salt or sea salt. Check with your health care provider or pharmacist before using salt substitutes.  Do not fry foods. Cook foods using healthy methods such as baking, boiling, grilling, and broiling instead.  Cook with  heart-healthy oils, such as olive, canola, soybean, or sunflower oil. Meal planning   Eat a balanced diet that includes: ? 5 or more servings of fruits and vegetables each day. At each meal, try to fill half of your plate with fruits and vegetables. ? Up to 6-8 servings of whole grains each day. ? Less than 6 oz of lean meat, poultry, or fish each day. A 3-oz serving of meat is about the same size as a deck of cards. One egg equals 1 oz. ? 2 servings of low-fat dairy each day. ? A serving of nuts, seeds, or beans 5 times each week. ? Heart-healthy fats. Healthy fats called Omega-3 fatty acids are found in foods such as flaxseeds and coldwater fish, like sardines, salmon, and mackerel.  Limit how much you eat of the following: ? Canned or prepackaged foods. ? Food that is high in trans fat, such as fried foods. ? Food that is high in saturated fat, such as fatty meat. ? Sweets, desserts, sugary drinks, and other foods with added sugar. ? Full-fat dairy products.  Do not salt foods before eating.  Try to eat at least 2 vegetarian meals each week.  Eat more home-cooked food and less restaurant, buffet, and fast food.  When eating at a restaurant, ask that your food be prepared with less salt or no salt, if possible. What foods are recommended? The items listed may not be a complete list. Talk with your dietitian about what   dietary choices are best for you. Grains Whole-grain or whole-wheat bread. Whole-grain or whole-wheat pasta. Brown rice. Oatmeal. Quinoa. Bulgur. Whole-grain and low-sodium cereals. Pita bread. Low-fat, low-sodium crackers. Whole-wheat flour tortillas. Vegetables Fresh or frozen vegetables (raw, steamed, roasted, or grilled). Low-sodium or reduced-sodium tomato and vegetable juice. Low-sodium or reduced-sodium tomato sauce and tomato paste. Low-sodium or reduced-sodium canned vegetables. Fruits All fresh, dried, or frozen fruit. Canned fruit in natural juice (without  added sugar). Meat and other protein foods Skinless chicken or turkey. Ground chicken or turkey. Pork with fat trimmed off. Fish and seafood. Egg whites. Dried beans, peas, or lentils. Unsalted nuts, nut butters, and seeds. Unsalted canned beans. Lean cuts of beef with fat trimmed off. Low-sodium, lean deli meat. Dairy Low-fat (1%) or fat-free (skim) milk. Fat-free, low-fat, or reduced-fat cheeses. Nonfat, low-sodium ricotta or cottage cheese. Low-fat or nonfat yogurt. Low-fat, low-sodium cheese. Fats and oils Soft margarine without trans fats. Vegetable oil. Low-fat, reduced-fat, or light mayonnaise and salad dressings (reduced-sodium). Canola, safflower, olive, soybean, and sunflower oils. Avocado. Seasoning and other foods Herbs. Spices. Seasoning mixes without salt. Unsalted popcorn and pretzels. Fat-free sweets. What foods are not recommended? The items listed may not be a complete list. Talk with your dietitian about what dietary choices are best for you. Grains Baked goods made with fat, such as croissants, muffins, or some breads. Dry pasta or rice meal packs. Vegetables Creamed or fried vegetables. Vegetables in a cheese sauce. Regular canned vegetables (not low-sodium or reduced-sodium). Regular canned tomato sauce and paste (not low-sodium or reduced-sodium). Regular tomato and vegetable juice (not low-sodium or reduced-sodium). Pickles. Olives. Fruits Canned fruit in a light or heavy syrup. Fried fruit. Fruit in cream or butter sauce. Meat and other protein foods Fatty cuts of meat. Ribs. Fried meat. Bacon. Sausage. Bologna and other processed lunch meats. Salami. Fatback. Hotdogs. Bratwurst. Salted nuts and seeds. Canned beans with added salt. Canned or smoked fish. Whole eggs or egg yolks. Chicken or turkey with skin. Dairy Whole or 2% milk, cream, and half-and-half. Whole or full-fat cream cheese. Whole-fat or sweetened yogurt. Full-fat cheese. Nondairy creamers. Whipped toppings.  Processed cheese and cheese spreads. Fats and oils Butter. Stick margarine. Lard. Shortening. Ghee. Bacon fat. Tropical oils, such as coconut, palm kernel, or palm oil. Seasoning and other foods Salted popcorn and pretzels. Onion salt, garlic salt, seasoned salt, table salt, and sea salt. Worcestershire sauce. Tartar sauce. Barbecue sauce. Teriyaki sauce. Soy sauce, including reduced-sodium. Steak sauce. Canned and packaged gravies. Fish sauce. Oyster sauce. Cocktail sauce. Horseradish that you find on the shelf. Ketchup. Mustard. Meat flavorings and tenderizers. Bouillon cubes. Hot sauce and Tabasco sauce. Premade or packaged marinades. Premade or packaged taco seasonings. Relishes. Regular salad dressings. Where to find more information:  National Heart, Lung, and Blood Institute: www.nhlbi.nih.gov  American Heart Association: www.heart.org Summary  The DASH eating plan is a healthy eating plan that has been shown to reduce high blood pressure (hypertension). It may also reduce your risk for type 2 diabetes, heart disease, and stroke.  With the DASH eating plan, you should limit salt (sodium) intake to 2,300 mg a day. If you have hypertension, you may need to reduce your sodium intake to 1,500 mg a day.  When on the DASH eating plan, aim to eat more fresh fruits and vegetables, whole grains, lean proteins, low-fat dairy, and heart-healthy fats.  Work with your health care provider or diet and nutrition specialist (dietitian) to adjust your eating plan to your individual   calorie needs. This information is not intended to replace advice given to you by your health care provider. Make sure you discuss any questions you have with your health care provider. Document Released: 01/31/2011 Document Revised: 02/05/2016 Document Reviewed: 02/05/2016 Elsevier Interactive Patient Education  2017 [employer].  

## 2016-09-04 LAB — COMPREHENSIVE METABOLIC PANEL
ALBUMIN: 4.5 g/dL (ref 3.6–4.8)
ALT: 14 IU/L (ref 0–32)
AST: 16 IU/L (ref 0–40)
Albumin/Globulin Ratio: 2 (ref 1.2–2.2)
Alkaline Phosphatase: 82 IU/L (ref 39–117)
BILIRUBIN TOTAL: 0.3 mg/dL (ref 0.0–1.2)
BUN / CREAT RATIO: 25 (ref 12–28)
BUN: 17 mg/dL (ref 8–27)
CALCIUM: 9.7 mg/dL (ref 8.7–10.3)
CHLORIDE: 103 mmol/L (ref 96–106)
CO2: 26 mmol/L (ref 20–29)
CREATININE: 0.68 mg/dL (ref 0.57–1.00)
GFR, EST AFRICAN AMERICAN: 109 mL/min/{1.73_m2} (ref >59)
GFR, EST NON AFRICAN AMERICAN: 95 mL/min/{1.73_m2} (ref >59)
GLUCOSE: 85 mg/dL (ref 65–99)
Globulin, Total: 2.3 g/dL (ref 1.5–4.5)
Potassium: 4.4 mmol/L (ref 3.5–5.2)
Sodium: 142 mmol/L (ref 134–144)
TOTAL PROTEIN: 6.8 g/dL (ref 6.0–8.5)

## 2016-09-04 LAB — CBC WITH DIFFERENTIAL/PLATELET
BASOS ABS: 0.1 10*3/uL (ref 0.0–0.2)
BASOS: 1 %
EOS (ABSOLUTE): 0.1 10*3/uL (ref 0.0–0.4)
Eos: 1 %
HEMOGLOBIN: 14.2 g/dL (ref 11.1–15.9)
Hematocrit: 42 % (ref 34.0–46.6)
IMMATURE GRANS (ABS): 0 10*3/uL (ref 0.0–0.1)
Immature Granulocytes: 0 %
LYMPHS: 39 %
Lymphocytes Absolute: 2.3 10*3/uL (ref 0.7–3.1)
MCH: 29.5 pg (ref 26.6–33.0)
MCHC: 33.8 g/dL (ref 31.5–35.7)
MCV: 87 fL (ref 79–97)
MONOCYTES: 8 %
Monocytes Absolute: 0.5 10*3/uL (ref 0.1–0.9)
NEUTROS ABS: 3.1 10*3/uL (ref 1.4–7.0)
Neutrophils: 51 %
Platelets: 366 10*3/uL (ref 150–379)
RBC: 4.82 x10E6/uL (ref 3.77–5.28)
RDW: 13.8 % (ref 12.3–15.4)
WBC: 6 10*3/uL (ref 3.4–10.8)

## 2016-09-04 LAB — HEMOGLOBIN A1C
Est. average glucose Bld gHb Est-mCnc: 117 mg/dL
Hgb A1c MFr Bld: 5.7 % — ABNORMAL HIGH (ref 4.8–5.6)

## 2016-09-04 LAB — LIPID PANEL W/O CHOL/HDL RATIO
CHOLESTEROL TOTAL: 213 mg/dL — AB (ref 100–199)
HDL: 48 mg/dL (ref >39)
LDL Calculated: 120 mg/dL — ABNORMAL HIGH (ref 0–99)
TRIGLYCERIDES: 225 mg/dL — AB (ref 0–149)
VLDL Cholesterol Cal: 45 mg/dL — ABNORMAL HIGH (ref 5–40)

## 2016-09-04 LAB — TSH: TSH: 1.82 u[IU]/mL (ref 0.450–4.500)

## 2016-09-05 ENCOUNTER — Encounter: Payer: Self-pay | Admitting: Family Medicine

## 2016-09-05 ENCOUNTER — Telehealth: Payer: Self-pay | Admitting: Family Medicine

## 2016-09-05 ENCOUNTER — Other Ambulatory Visit: Payer: Self-pay | Admitting: Family Medicine

## 2016-09-05 MED ORDER — ATORVASTATIN CALCIUM 10 MG PO TABS: 10.0000 mg | ORAL_TABLET | Freq: Every day | ORAL | 1 refills | Status: DC

## 2016-09-05 MED ORDER — SULFAMETHOXAZOLE-TRIMETHOPRIM 800-160 MG PO TABS: 1.0000 | ORAL_TABLET | Freq: Two times a day (BID) | ORAL | 0 refills | Status: DC

## 2016-09-05 NOTE — Telephone Encounter (Signed)
Patient notified of results. She states she doesn't want to take the cholesterol med due to hearing it causes muscle pains. She says she has never tried them before. I advised her that it was a low dose and that not every patient has those side effects. I told her to at least think it over and give it a try. And if not, to at least work really hard on her diet and exercise. Patient understood.

## 2016-09-05 NOTE — Telephone Encounter (Signed)
Please call and let her know her labs came back normal other than the mild UTI we've already discussed as well as a slightly elevated A1C that just barely puts her in the prediabetes range - this can be helped greatly with good diet changes and exercise. Cholesterol is still not at goal, fairly unchanged from last year. I am going to send over a cholesterol medication for her to start and we will recheck things in 6 months to see how that is going

## 2016-09-09 LAB — COLOGUARD: COLOGUARD: NEGATIVE

## 2016-12-03 ENCOUNTER — Ambulatory Visit (INDEPENDENT_AMBULATORY_CARE_PROVIDER_SITE_OTHER): Payer: Managed Care, Other (non HMO) | Admitting: Family Medicine

## 2016-12-03 ENCOUNTER — Encounter: Payer: Self-pay | Admitting: Family Medicine

## 2016-12-03 VITALS — BP 142/87 | HR 78 | Temp 97.9°F | Ht <= 58 in | Wt 172.3 lb

## 2016-12-03 DIAGNOSIS — E7800 Pure hypercholesterolemia, unspecified: Secondary | ICD-10-CM | POA: Diagnosis not present

## 2016-12-03 DIAGNOSIS — R112 Nausea with vomiting, unspecified: Secondary | ICD-10-CM | POA: Diagnosis not present

## 2016-12-03 DIAGNOSIS — F419 Anxiety disorder, unspecified: Secondary | ICD-10-CM

## 2016-12-03 MED ORDER — ALPRAZOLAM 0.25 MG PO TABS: 0.2500 mg | ORAL_TABLET | Freq: Two times a day (BID) | ORAL | 0 refills | Status: DC | PRN

## 2016-12-03 MED ORDER — SUCRALFATE 1 G PO TABS: 1.0000 g | ORAL_TABLET | Freq: Three times a day (TID) | ORAL | 1 refills | Status: DC | PRN

## 2016-12-03 NOTE — Progress Notes (Signed)
BP (!) 142/87 (BP Location: Right Arm, Patient Position: Sitting, Cuff Size: Normal)   Pulse 78   Temp 97.9 F (36.6 C)   Ht  (1.473 m)   Wt 172 lb 4.8 oz (78.2 kg)   LMP  (LMP Unknown)   SpO2 97%   BMI 36.01 kg/m    Subjective:    Patient ID: Beth James, female    DOB: [birth year], 61 y.o.   MRN: [id number]  HPI: Beth James is a 61 y.o. female  Chief Complaint  Patient presents with  . Anxiety    x's 1 week. Gives stomach issues.  . Stress  . Nausea   Patient presents for worsening anxiety that seems to be related to some major changes at work lately. Having GI issues along with the stress. Cramping, nausea, dry heaving. Feels like it's impacting her ability to function at work as well as her home life. Has never taken anything for something like this before.   Wanting to know what she can be doing instead of taking a statin for her cholesterol. Has started red yeast rice and trying to improve lifestyle behaviors, very scared of taking statins. Denies CP, SOB, claudication.   No past medical history on file.  Social History   Social History  . Marital status: Married    Spouse name: N/A  . Number of children: N/A  . Years of education: N/A   Occupational History  . Not on file.   Social History Main Topics  . Smoking status: Never Smoker  . Smokeless tobacco: Never Used  . Alcohol use No  . Drug use: No  . Sexual activity: Yes   Other Topics Concern  . Not on file   Social History Narrative  . No narrative on file    Relevant past medical, surgical, family and social history reviewed and updated as indicated. Interim medical history since our last visit reviewed. Allergies and medications reviewed and updated.  Review of Systems  Constitutional: Negative.   HENT: Negative.   Respiratory: Negative.   Cardiovascular: Negative.   Gastrointestinal: Positive for nausea.  Musculoskeletal: Negative.   Neurological: Negative.     Psychiatric/Behavioral: The patient is nervous/anxious.    Per HPI unless specifically indicated above     Objective:    BP (!) 142/87 (BP Location: Right Arm, Patient Position: Sitting, Cuff Size: Normal)   Pulse 78   Temp 97.9 F (36.6 C)   Ht  (1.473 m)   Wt 172 lb 4.8 oz (78.2 kg)   LMP  (LMP Unknown)   SpO2 97%   BMI 36.01 kg/m   Wt Readings from Last 3 Encounters:  12/03/16 172 lb 4.8 oz (78.2 kg)  09/03/16 174 lb (78.9 kg)  04/17/16 173 lb 14.4 oz (78.9 kg)    Physical Exam  Constitutional: She is oriented to person, place, and time. She appears well-developed and well-nourished. No distress.  HENT:  Head: Atraumatic.  Eyes: Pupils are equal, round, and reactive to light. Conjunctivae are normal.  Neck: Normal range of motion. Neck supple.  Cardiovascular: Normal rate and normal heart sounds.   Pulmonary/Chest: Effort normal and breath sounds normal.  Musculoskeletal: Normal range of motion.  Neurological: She is alert and oriented to person, place, and time.  Skin: Skin is warm and dry.  Psychiatric: She has a normal mood and affect. Her behavior is normal.  Nursing note and vitals reviewed.     Assessment & Plan:  Problem List Items Addressed This Visit    None    Visit Diagnoses    Anxiety    -  Primary   Seems like a temporary adjustment situation. Will start low dose xanax on as needed basis no more than BID. Pt aware this is not long term. Counseling reviewed   Relevant Medications   ALPRAZolam (XANAX) 0.25 MG tablet   Non-intractable vomiting with nausea, unspecified vomiting type       Start carafate to help soothe her nervous stomach. This should improve once anxiety is under better control   Elevated cholesterol       Continue lifestyle modifications and red yeast rice. Will recheck in 6 months and start low dose statin if no improvement       Follow up plan: Return in about 4 weeks (around 12/31/2016) for Anxiety f/u.

## 2016-12-05 NOTE — Patient Instructions (Signed)
Follow up in 1 month   

## 2016-12-31 ENCOUNTER — Ambulatory Visit: Payer: Managed Care, Other (non HMO) | Admitting: Unknown Physician Specialty

## 2017-01-03 ENCOUNTER — Ambulatory Visit: Payer: Managed Care, Other (non HMO) | Admitting: Family Medicine

## 2017-01-03 VITALS — BP 139/73 | HR 80 | Temp 98.2°F | Wt 176.0 lb

## 2017-01-03 DIAGNOSIS — R1310 Dysphagia, unspecified: Secondary | ICD-10-CM

## 2017-01-03 DIAGNOSIS — F419 Anxiety disorder, unspecified: Secondary | ICD-10-CM

## 2017-01-03 NOTE — Progress Notes (Signed)
BP 139/73 (BP Location: Left Arm, Patient Position: Sitting, Cuff Size: Normal)   Pulse 80   Temp 98.2 F (36.8 C) (Oral)   Wt 176 lb (79.8 kg)   LMP  (LMP Unknown)   SpO2 99%   BMI 36.78 kg/m    Subjective:    Patient ID: Beth CarryPamela L James, female    DOB: 01/03/1956, 61 y.o.   MRN: [id number]  HPI: Beth Carryamela L James is a 61 y.o. female  Chief Complaint  Patient presents with  . Anxiety    4 week follow up. Patient stated her anxiety was doing better.   . Throat Problem    Patient stated she's still have problems with her throat. It feels like it's swelling, or going to close, possibly allergic. Tried gargling, cough drops, no relief.    Patient presents today for 4 week f/u anxiety. Was having some major changes at work that were causing severe distress to her to the point where she was having trouble performing daily activities. Started on .25 mg xanax prn, did well but could only tolerate half a tab every once in a while. Has not taken one in 2 weeks and feels she no longer needs them as things settled down at work. Doing very well now overall.    Still feeling like her throat is blocked, comes and goes. Sensation lasts several days at a time. Tried benadryl with some relief, but always comes back. Does take allegra and flonase daily. Does have chronic acid reflux, has always had minor swallowing issues from that. No dysphagia or choking with liquids, breathing has never been affected.   Relevant past medical, surgical, family and social history reviewed and updated as indicated. Interim medical history since our last visit reviewed. Allergies and medications reviewed and updated.  Review of Systems  Constitutional: Negative.   HENT: Positive for trouble swallowing.   Eyes: Negative.   Respiratory: Negative.   Cardiovascular: Negative.   Gastrointestinal: Negative.   Genitourinary: Negative.   Musculoskeletal: Negative.   Skin: Negative.   Neurological: Negative.     Psychiatric/Behavioral: Negative.    Per HPI unless specifically indicated above     Objective:    BP 139/73 (BP Location: Left Arm, Patient Position: Sitting, Cuff Size: Normal)   Pulse 80   Temp 98.2 F (36.8 C) (Oral)   Wt 176 lb (79.8 kg)   LMP  (LMP Unknown)   SpO2 99%   BMI 36.78 kg/m   Wt Readings from Last 3 Encounters:  01/03/17 176 lb (79.8 kg)  12/03/16 172 lb 4.8 oz (78.2 kg)  09/03/16 174 lb (78.9 kg)    Physical Exam  Constitutional: She is oriented to person, place, and time. She appears well-developed and well-nourished. No distress.  HENT:  Head: Atraumatic.  Eyes: Conjunctivae are normal. Pupils are equal, round, and reactive to light. No scleral icterus.  Neck: Normal range of motion. Neck supple. No thyromegaly present.  Cardiovascular: Normal rate and normal heart sounds.  Pulmonary/Chest: Effort normal and breath sounds normal. No respiratory distress.  Musculoskeletal: Normal range of motion.  Lymphadenopathy:    She has no cervical adenopathy.  Neurological: She is alert and oriented to person, place, and time.  Skin: Skin is warm and dry.  Psychiatric: She has a normal mood and affect. Her behavior is normal.  Nursing note and vitals reviewed.     Assessment & Plan:   Problem List Items Addressed This Visit    None  Visit Diagnoses    Anxiety    -  Primary   Well controlled without medications at this time. No longer needing the xanax.    Dysphagia, unspecified type       Still occurring despite anxiety being well controlled. No physical abnormalities noted, continue carafate and omeprazole, f/u with ENT for throat eval       Follow up plan: Return if symptoms worsen or fail to improve.

## 2017-01-05 ENCOUNTER — Encounter: Payer: Self-pay | Admitting: Family Medicine

## 2017-01-05 NOTE — Patient Instructions (Signed)
Follow up as needed

## 2017-04-29 ENCOUNTER — Encounter: Payer: Self-pay | Admitting: Unknown Physician Specialty

## 2017-09-09 ENCOUNTER — Encounter: Payer: Managed Care, Other (non HMO) | Admitting: Family Medicine

## 2017-09-09 ENCOUNTER — Encounter: Payer: Managed Care, Other (non HMO) | Admitting: Unknown Physician Specialty

## 2017-10-03 ENCOUNTER — Encounter: Payer: Self-pay | Admitting: Family Medicine

## 2017-10-03 ENCOUNTER — Other Ambulatory Visit: Payer: Self-pay

## 2017-10-03 ENCOUNTER — Telehealth: Payer: Self-pay | Admitting: Family Medicine

## 2017-10-03 ENCOUNTER — Ambulatory Visit (INDEPENDENT_AMBULATORY_CARE_PROVIDER_SITE_OTHER): Payer: Managed Care, Other (non HMO) | Admitting: Family Medicine

## 2017-10-03 VITALS — BP 144/80 | HR 78 | Temp 98.1°F | Ht 59.0 in | Wt 171.0 lb

## 2017-10-03 DIAGNOSIS — J309 Allergic rhinitis, unspecified: Secondary | ICD-10-CM

## 2017-10-03 DIAGNOSIS — R21 Rash and other nonspecific skin eruption: Secondary | ICD-10-CM

## 2017-10-03 DIAGNOSIS — R8290 Unspecified abnormal findings in urine: Secondary | ICD-10-CM | POA: Diagnosis not present

## 2017-10-03 DIAGNOSIS — Z1231 Encounter for screening mammogram for malignant neoplasm of breast: Secondary | ICD-10-CM | POA: Diagnosis not present

## 2017-10-03 DIAGNOSIS — R030 Elevated blood-pressure reading, without diagnosis of hypertension: Secondary | ICD-10-CM

## 2017-10-03 DIAGNOSIS — Z114 Encounter for screening for human immunodeficiency virus [HIV]: Secondary | ICD-10-CM

## 2017-10-03 DIAGNOSIS — Z0000 Encounter for general adult medical examination without abnormal findings: Secondary | ICD-10-CM | POA: Diagnosis not present

## 2017-10-03 DIAGNOSIS — E785 Hyperlipidemia, unspecified: Secondary | ICD-10-CM | POA: Insufficient documentation

## 2017-10-03 DIAGNOSIS — Z1239 Encounter for other screening for malignant neoplasm of breast: Secondary | ICD-10-CM

## 2017-10-03 DIAGNOSIS — E782 Mixed hyperlipidemia: Secondary | ICD-10-CM | POA: Diagnosis not present

## 2017-10-03 DIAGNOSIS — Z1159 Encounter for screening for other viral diseases: Secondary | ICD-10-CM

## 2017-10-03 DIAGNOSIS — F419 Anxiety disorder, unspecified: Secondary | ICD-10-CM

## 2017-10-03 MED ORDER — TRIAMCINOLONE ACETONIDE 0.1 % EX CREA: 1.0000 "application " | TOPICAL_CREAM | Freq: Two times a day (BID) | CUTANEOUS | 0 refills | Status: DC

## 2017-10-03 NOTE — Progress Notes (Signed)
BP (!) 144/80   Pulse 78   Temp 98.1 F (36.7 C) (Oral)   Ht 4\' 11"  (1.499 m)   Wt 171 lb (77.6 kg)   LMP  (LMP Unknown)   SpO2 97%   BMI 34.54 kg/m    Subjective:    Patient ID: Beth James, female    DOB: 21-Sep-1955, 62 y.o.   MRN: [id number]  HPI: Beth James is a 62 y.o. female presenting on 10/03/2017 for comprehensive medical examination. Current medical complaints include:see below  Red patch on chest for several weeks, cortisone cream helping but not taking it away. Sometimes slightly itchy.   Taking aleve nightly for her OA.   Depression Screen done today and results listed below:  Depression screen [facility] 2/9 10/03/2017 12/03/2016 09/03/2016 08/15/2015  Decreased Interest 0 0 0 0  Down, Depressed, Hopeless 0 0 0 0  PHQ - 2 Score 0 0 0 0  Altered sleeping 0 1 - -  Tired, decreased energy 1 0 - -  Change in appetite 0 0 - -  Feeling bad or failure about yourself  0 0 - -  Trouble concentrating 0 0 - -  Moving slowly or fidgety/restless 0 0 - -  Suicidal thoughts 0 0 - -  PHQ-9 Score 1 1 - -    The patient does not have a history of falls. I did not complete a risk assessment for falls. A plan of care for falls was not documented.   Past Medical History:  History reviewed. No pertinent past medical history.  Surgical History:  Past Surgical History:  Procedure Laterality Date  . bone spur removal     left shoulder  . cartilage removal     left knee  . Clogged Tear Duct Stint placed      Medications:  Current Outpatient Medications on File Prior to Visit  Medication Sig  . ALPRAZolam (XANAX) 0.25 MG tablet Take 0.25 mg by mouth at bedtime as needed for anxiety.  . Cholecalciferol (VITAMIN D PO) Take by mouth daily.  . fluticasone (FLONASE) 50 MCG/ACT nasal spray Place into both nostrils daily.  . naproxen sodium (ALEVE) 220 MG tablet Take 440 mg by mouth at bedtime.  . Olopatadine HCl 0.2 % SOLN instill 1 drop into both eyes once daily if needed  .  omeprazole (PRILOSEC OTC) 20 MG tablet Take 20 mg by mouth daily.  Marland Kitchen OVER THE COUNTER MEDICATION daily.   No current facility-administered medications on file prior to visit.     Allergies:  Allergies  Allergen Reactions  . Codeine Other (See Comments)    jittery  . Tape Rash    Needs to use paper tape    Social History:  Social History   Socioeconomic History  . Marital status: Married    Spouse name: Not on file  . Number of children: Not on file  . Years of education: Not on file  . Highest education level: Not on file  Occupational History  . Not on file  Social Needs  . Financial resource strain: Not on file  . Food insecurity:    Worry: Not on file    Inability: Not on file  . Transportation needs:    Medical: Not on file    Non-medical: Not on file  Tobacco Use  . Smoking status: Never Smoker  . Smokeless tobacco: Never Used  Substance and Sexual Activity  . Alcohol use: No    Alcohol/week: 0.0 standard drinks  .  Drug use: No  . Sexual activity: Yes  Lifestyle  . Physical activity:    Days per week: Not on file    Minutes per session: Not on file  . Stress: Not on file  Relationships  . Social connections:    Talks on phone: Not on file    Gets together: Not on file    Attends religious service: Not on file    Active member of club or organization: Not on file    Attends meetings of clubs or organizations: Not on file    Relationship status: Not on file  . Intimate partner violence:    Fear of current or ex partner: Not on file    Emotionally abused: Not on file    Physically abused: Not on file    Forced sexual activity: Not on file  Other Topics Concern  . Not on file  Social History Narrative  . Not on file   Social History   Tobacco Use  Smoking Status Never Smoker  Smokeless Tobacco Never Used   Social History   Substance and Sexual Activity  Alcohol Use No  . Alcohol/week: 0.0 standard drinks    Family History:  Family  History  Problem Relation Age of Onset  . Heart disease Mother        CHF  . Heart disease Father   . Cancer Father        lung  . Heart disease Maternal Grandmother        CHF  . Cancer Other        colon    Past medical history, surgical history, medications, allergies, family history and social history reviewed with patient today and changes made to appropriate areas of the chart.   Review of Systems - General ROS: negative Psychological ROS: negative Ophthalmic ROS: negative ENT ROS: negative Allergy and Immunology ROS: negative Hematological and Lymphatic ROS: negative Breast ROS: negative for breast lumps Respiratory ROS: no cough, shortness of breath, or wheezing Cardiovascular ROS: no chest pain or dyspnea on exertion Gastrointestinal ROS: no abdominal pain, change in bowel habits, or black or bloody stools Genito-Urinary ROS: no dysuria, trouble voiding, or hematuria Musculoskeletal ROS: negative Neurological ROS: no TIA or stroke symptoms Dermatological ROS: positive for rash All other ROS negative except what is listed above and in the HPI.      Objective:    BP (!) 144/80   Pulse 78   Temp 98.1 F (36.7 C) (Oral)   Ht 4\' 11"  (1.499 m)   Wt 171 lb (77.6 kg)   LMP  (LMP Unknown)   SpO2 97%   BMI 34.54 kg/m   Wt Readings from Last 3 Encounters:  10/03/17 171 lb (77.6 kg)  01/03/17 176 lb (79.8 kg)  12/03/16 172 lb 4.8 oz (78.2 kg)    Physical Exam  Constitutional: She is oriented to person, place, and time. She appears well-developed and well-nourished. No distress.  HENT:  Head: Atraumatic.  Right Ear: External ear normal.  Left Ear: External ear normal.  Nose: Nose normal.  Mouth/Throat: Oropharynx is clear and moist. No oropharyngeal exudate.  Eyes: Pupils are equal, round, and reactive to light. Conjunctivae are normal. No scleral icterus.  Neck: Normal range of motion. Neck supple. No thyromegaly present.  Cardiovascular: Normal rate, regular  rhythm, normal heart sounds and intact distal pulses.  Pulmonary/Chest: Effort normal and breath sounds normal. No respiratory distress. Right breast exhibits no mass, no skin change and no tenderness. Left  breast exhibits no mass, no skin change and no tenderness.  Abdominal: Soft. Bowel sounds are normal. She exhibits no mass. There is no tenderness.  Genitourinary:  Genitourinary Comments: GU exam declined  Musculoskeletal: Normal range of motion. She exhibits no edema or tenderness.  Lymphadenopathy:    She has no cervical adenopathy.    She has no axillary adenopathy.  Neurological: She is alert and oriented to person, place, and time. No cranial nerve deficit.  Skin: Skin is warm and dry. No rash noted.  Psychiatric: She has a normal mood and affect. Her behavior is normal.  Nursing note and vitals reviewed.   Results for orders placed or performed in visit on 10/03/17  Microscopic Examination  Result Value Ref Range   WBC, UA 6-10 (A) 0 - 5 /hpf   RBC, UA None seen 0 - 2 /hpf   Epithelial Cells (non renal) 0-10 0 - 10 /hpf   Bacteria, UA Moderate (A) None seen/Few  Urine Culture, Reflex  Result Value Ref Range   Urine Culture, Routine WILL FOLLOW   UA/M w/rflx Culture, Routine  Result Value Ref Range   Specific Gravity, UA 1.015 1.005 - 1.030   pH, UA 5.5 5.0 - 7.5   Color, UA Yellow Yellow   Appearance Ur Cloudy (A) Clear   Leukocytes, UA 2+ (A) Negative   Protein, UA Negative Negative/Trace   Glucose, UA Negative Negative   Ketones, UA Negative Negative   RBC, UA Trace (A) Negative   Bilirubin, UA Negative Negative   Urobilinogen, Ur 0.2 0.2 - 1.0 mg/dL   Nitrite, UA Negative Negative   Microscopic Examination See below:    Urinalysis Reflex Comment       Assessment & Plan:   Problem List Items Addressed This Visit      Respiratory   Rhinitis, allergic    Stable on prn flonase and antihistamines, continue current regimen        Other   Anxiety - Primary      Taking prn xanax with good relief. Continue current regimen. Does not take often      Relevant Medications   ALPRAZolam (XANAX) 0.25 MG tablet   Hyperlipidemia    Taking natural supplements for this, scared of statins. Just started intermittent fasting and has had some successful early weight loss - hoping to continue on this track. Increase exercise as tolerated       Other Visit Diagnoses    Annual physical exam       Relevant Orders   CBC with Differential/Platelet   Comprehensive metabolic panel   Lipid Panel w/o Chol/HDL Ratio   UA/M w/rflx Culture, Routine (Completed)   TSH   Screening for breast cancer       Relevant Orders   MM Digital Screening   Elevated BP without diagnosis of hypertension       Pt not ready to start a medication, long talk about DASH diet, increasing exercise, weight loss, decreasing NSAID use. Recheck in 6 months   Need for hepatitis C screening test       Relevant Orders   Hepatitis C antibody   Screening for HIV (human immunodeficiency virus)       Relevant Orders   HIV antibody   Abnormal urinalysis       Relevant Orders   Urine Culture   Rash       Suspect eczema, triamcinolone sent, moisturization daily discussed. F/u if not resolving       Follow  up plan: Return in about 6 months (around 04/05/2018) for BP check.   LABORATORY TESTING:  - Pap smear: up to date  IMMUNIZATIONS:   - Tdap: Tetanus vaccination status reviewed: last tetanus booster within 10 years. - Influenza: Postponed to flu season  SCREENING: -Mammogram: Ordered today  - Colonoscopy: Up to date   PATIENT COUNSELING:   Advised to take 1 mg of folate supplement per day if capable of pregnancy.   Sexuality: Discussed sexually transmitted diseases, partner selection, use of condoms, avoidance of unintended pregnancy  and contraceptive alternatives.   Advised to avoid cigarette smoking.  I discussed with the patient that most people either abstain from alcohol  or drink within safe limits (<=14/week and <=4 drinks/occasion for males, <=7/weeks and <= 3 drinks/occasion for females) and that the risk for alcohol disorders and other health effects rises proportionally with the number of drinks per week and how often a drinker exceeds daily limits.  Discussed cessation/primary prevention of drug use and availability of treatment for abuse.   Diet: Encouraged to adjust caloric intake to maintain  or achieve ideal body weight, to reduce intake of dietary saturated fat and total fat, to limit sodium intake by avoiding high sodium foods and not adding table salt, and to maintain adequate dietary potassium and calcium preferably from fresh fruits, vegetables, and low-fat dairy products.    stressed the importance of regular exercise  Injury prevention: Discussed safety belts, safety helmets, smoke detector, smoking near bedding or upholstery.   Dental health: Discussed importance of regular tooth brushing, flossing, and dental visits.    NEXT PREVENTATIVE PHYSICAL DUE IN 1 YEAR. Return in about 6 months (around 04/05/2018) for BP check.

## 2017-10-03 NOTE — Assessment & Plan Note (Addendum)
Taking natural supplements for this, scared of statins. Just started intermittent fasting and has had some successful early weight loss - hoping to continue on this track. Increase exercise as tolerated

## 2017-10-03 NOTE — Telephone Encounter (Signed)
Called pt and discussed trying triamcinolone cream. Call with no improvement

## 2017-10-03 NOTE — Patient Instructions (Addendum)
[facility] [phone]    DASH Eating Plan DASH stands for "Dietary Approaches to Stop Hypertension." The DASH eating plan is a healthy eating plan that has been shown to reduce high blood pressure (hypertension). It may also reduce your risk for type 2 diabetes, heart disease, and stroke. The DASH eating plan may also help with weight loss. What are tips for following this plan? General guidelines  Avoid eating more than 2,300 mg (milligrams) of salt (sodium) a day. If you have hypertension, you may need to reduce your sodium intake to 1,500 mg a day.  Limit alcohol intake to no more than 1 drink a day for nonpregnant women and 2 drinks a day for men. One drink equals 12 oz of beer, 5 oz of wine, or 1 oz of hard liquor.  Work with your health care provider to maintain a healthy body weight or to lose weight. Ask what an ideal weight is for you.  Get at least 30 minutes of exercise that causes your heart to beat faster (aerobic exercise) most days of the week. Activities may include walking, swimming, or biking.  Work with your health care provider or diet and nutrition specialist (dietitian) to adjust your eating plan to your individual calorie needs. Reading food labels  Check food labels for the amount of sodium per serving. Choose foods with less than 5 percent of the Daily Value of sodium. Generally, foods with less than 300 mg of sodium per serving fit into this eating plan.  To find whole grains, look for the word "whole" as the first word in the ingredient list. Shopping  Buy products labeled as "low-sodium" or "no salt added."  Buy fresh foods. Avoid canned foods and premade or frozen meals. Cooking  Avoid adding salt when cooking. Use salt-free seasonings or herbs instead of table salt or sea salt. Check with your health care provider or pharmacist before using salt substitutes.  Do not fry foods. Cook foods using healthy methods such as baking, boiling,  grilling, and broiling instead.  Cook with heart-healthy oils, such as olive, canola, soybean, or sunflower oil. Meal planning   Eat a balanced diet that includes: ? 5 or more servings of fruits and vegetables each day. At each meal, try to fill half of your plate with fruits and vegetables. ? Up to 6-8 servings of whole grains each day. ? Less than 6 oz of lean meat, poultry, or fish each day. A 3-oz serving of meat is about the same size as a deck of cards. One egg equals 1 oz. ? 2 servings of low-fat dairy each day. ? A serving of nuts, seeds, or beans 5 times each week. ? Heart-healthy fats. Healthy fats called Omega-3 fatty acids are found in foods such as flaxseeds and coldwater fish, like sardines, salmon, and mackerel.  Limit how much you eat of the following: ? Canned or prepackaged foods. ? Food that is high in trans fat, such as fried foods. ? Food that is high in saturated fat, such as fatty meat. ? Sweets, desserts, sugary drinks, and other foods with added sugar. ? Full-fat dairy products.  Do not salt foods before eating.  Try to eat at least 2 vegetarian meals each week.  Eat more home-cooked food and less restaurant, buffet, and fast food.  When eating at a restaurant, ask that your food be prepared with less salt or no salt, if possible. What foods are recommended? The items listed may not be a  complete list. Talk with your dietitian about what dietary choices are best for you. Grains Whole-grain or whole-wheat bread. Whole-grain or whole-wheat pasta. Brown rice. [place]row. Bulgur. Whole-grain and low-sodium cereals. Pita bread. Low-fat, low-sodium crackers. Whole-wheat flour tortillas. Vegetables Fresh or frozen vegetables (raw, steamed, roasted, or grilled). Low-sodium or reduced-sodium tomato and vegetable juice. Low-sodium or reduced-sodium tomato sauce and tomato paste. Low-sodium or reduced-sodium canned vegetables. Fruits All fresh, dried, or frozen  fruit. Canned fruit in natural juice (without added sugar). Meat and other protein foods Skinless chicken or [place]. Ground chicken or [place]. Pork with fat trimmed off. Fish and seafood. Egg whites. Dried beans, peas, or lentils. Unsalted nuts, nut butters, and seeds. Unsalted canned beans. Lean cuts of beef with fat trimmed off. Low-sodium, lean deli meat. Dairy Low-fat (1%) or fat-free (skim) milk. Fat-free, low-fat, or reduced-fat cheeses. Nonfat, low-sodium ricotta or cottage cheese. Low-fat or nonfat yogurt. Low-fat, low-sodium cheese. Fats and oils Soft margarine without trans fats. Vegetable oil. Low-fat, reduced-fat, or light mayonnaise and salad dressings (reduced-sodium). Canola, safflower, olive, soybean, and sunflower oils. Avocado. Seasoning and other foods Herbs. Spices. Seasoning mixes without salt. Unsalted popcorn and pretzels. Fat-free sweets. What foods are not recommended? The items listed may not be a complete list. Talk with your dietitian about what dietary choices are best for you. Grains Baked goods made with fat, such as croissants, muffins, or some breads. Dry pasta or rice meal packs. Vegetables Creamed or fried vegetables. Vegetables in a cheese sauce. Regular canned vegetables (not low-sodium or reduced-sodium). Regular canned tomato sauce and paste (not low-sodium or reduced-sodium). Regular tomato and vegetable juice (not low-sodium or reduced-sodium). Angie Fava. Olives. Fruits Canned fruit in a light or heavy syrup. Fried fruit. Fruit in cream or butter sauce. Meat and other protein foods Fatty cuts of meat. Ribs. Fried meat. Berniece Salines. Sausage. Bologna and other processed lunch meats. Salami. Fatback. Hotdogs. Bratwurst. Salted nuts and seeds. Canned beans with added salt. Canned or smoked fish. Whole eggs or egg yolks. Chicken or [place] with skin. Dairy Whole or 2% milk, cream, and half-and-half. Whole or full-fat cream cheese. Whole-fat or sweetened yogurt. Full-fat  cheese. Nondairy creamers. Whipped toppings. Processed cheese and cheese spreads. Fats and oils Butter. Stick margarine. Lard. Shortening. Ghee. Bacon fat. Tropical oils, such as coconut, palm kernel, or palm oil. Seasoning and other foods Salted popcorn and pretzels. Onion salt, garlic salt, seasoned salt, table salt, and sea salt. Worcestershire sauce. Tartar sauce. Barbecue sauce. Teriyaki sauce. Soy sauce, including reduced-sodium. Steak sauce. Canned and packaged gravies. Fish sauce. Oyster sauce. Cocktail sauce. Horseradish that you find on the shelf. Ketchup. Mustard. Meat flavorings and tenderizers. Bouillon cubes. Hot sauce and Tabasco sauce. Premade or packaged marinades. Premade or packaged taco seasonings. Relishes. Regular salad dressings. Where to find more information:  National Heart, Lung, and [place]: https://wilson-eaton.com/  American Heart Association: www.heart.org Summary  The DASH eating plan is a healthy eating plan that has been shown to reduce high blood pressure (hypertension). It may also reduce your risk for type 2 diabetes, heart disease, and stroke.  With the DASH eating plan, you should limit salt (sodium) intake to 2,300 mg a day. If you have hypertension, you may need to reduce your sodium intake to 1,500 mg a day.  When on the DASH eating plan, aim to eat more fresh fruits and vegetables, whole grains, lean proteins, low-fat dairy, and heart-healthy fats.  Work with your health care provider or diet and nutrition specialist (dietitian)  to adjust your eating plan to your individual calorie needs. This information is not intended to replace advice given to you by your health care provider. Make sure you discuss any questions you have with your health care provider. Document Released: 01/31/2011 Document Revised: 02/05/2016 Document Reviewed: 02/05/2016 Elsevier Interactive Patient Education  [employer]2018 Elsevier Inc. Follow up in 6 months

## 2017-10-03 NOTE — Telephone Encounter (Signed)
Copied from CRM [id number]. Topic: Quick Communication - See Telephone Encounter >> Oct 03, 2017 11:17 AM Raquel SarnaHayes, Teresa G wrote: Pt was in for CPE this morning w/ R. Lane.  They discussed a cream for a red spot on pt chest.  [facility]

## 2017-10-03 NOTE — Assessment & Plan Note (Signed)
Stable on prn flonase and antihistamines, continue current regimen

## 2017-10-03 NOTE — Assessment & Plan Note (Signed)
Taking prn xanax with good relief. Continue current regimen. Does not take often

## 2017-10-04 LAB — COMPREHENSIVE METABOLIC PANEL
A/G RATIO: 1.9 (ref 1.2–2.2)
ALT: 12 IU/L (ref 0–32)
AST: 11 IU/L (ref 0–40)
Albumin: 4.4 g/dL (ref 3.6–4.8)
Alkaline Phosphatase: 81 IU/L (ref 39–117)
BUN/Creatinine Ratio: 21 (ref 12–28)
BUN: 17 mg/dL (ref 8–27)
Bilirubin Total: 0.3 mg/dL (ref 0.0–1.2)
CALCIUM: 9.8 mg/dL (ref 8.7–10.3)
CO2: 24 mmol/L (ref 20–29)
CREATININE: 0.8 mg/dL (ref 0.57–1.00)
Chloride: 103 mmol/L (ref 96–106)
GFR, EST AFRICAN AMERICAN: 91 mL/min/{1.73_m2} (ref >59)
GFR, EST NON AFRICAN AMERICAN: 79 mL/min/{1.73_m2} (ref >59)
GLOBULIN, TOTAL: 2.3 g/dL (ref 1.5–4.5)
Glucose: 84 mg/dL (ref 65–99)
POTASSIUM: 4.2 mmol/L (ref 3.5–5.2)
SODIUM: 141 mmol/L (ref 134–144)
TOTAL PROTEIN: 6.7 g/dL (ref 6.0–8.5)

## 2017-10-04 LAB — LIPID PANEL W/O CHOL/HDL RATIO
Cholesterol, Total: 211 mg/dL — ABNORMAL HIGH (ref 100–199)
HDL: 47 mg/dL (ref >39)
LDL CALC: 139 mg/dL — AB (ref 0–99)
Triglycerides: 127 mg/dL (ref 0–149)
VLDL Cholesterol Cal: 25 mg/dL (ref 5–40)

## 2017-10-04 LAB — CBC WITH DIFFERENTIAL/PLATELET
BASOS: 1 %
Basophils Absolute: 0 10*3/uL (ref 0.0–0.2)
EOS (ABSOLUTE): 0 10*3/uL (ref 0.0–0.4)
EOS: 1 %
HEMATOCRIT: 40.7 % (ref 34.0–46.6)
HEMOGLOBIN: 13.6 g/dL (ref 11.1–15.9)
IMMATURE GRANS (ABS): 0 10*3/uL (ref 0.0–0.1)
IMMATURE GRANULOCYTES: 0 %
LYMPHS: 34 %
Lymphocytes Absolute: 2 10*3/uL (ref 0.7–3.1)
MCH: 29.2 pg (ref 26.6–33.0)
MCHC: 33.4 g/dL (ref 31.5–35.7)
MCV: 87 fL (ref 79–97)
MONOCYTES: 9 %
Monocytes Absolute: 0.5 10*3/uL (ref 0.1–0.9)
NEUTROS PCT: 55 %
Neutrophils Absolute: 3.4 10*3/uL (ref 1.4–7.0)
Platelets: 368 10*3/uL (ref 150–450)
RBC: 4.66 x10E6/uL (ref 3.77–5.28)
RDW: 14.5 % (ref 12.3–15.4)
WBC: 6 10*3/uL (ref 3.4–10.8)

## 2017-10-04 LAB — HEPATITIS C ANTIBODY: Hep C Virus Ab: <0.1 s/co ratio (ref 0.0–0.9)

## 2017-10-04 LAB — TSH: TSH: 2.05 u[IU]/mL (ref 0.450–4.500)

## 2017-10-04 LAB — HIV ANTIBODY (ROUTINE TESTING W REFLEX): HIV SCREEN 4TH GENERATION: NONREACTIVE

## 2017-10-05 LAB — URINE CULTURE

## 2017-10-06 ENCOUNTER — Other Ambulatory Visit: Payer: Self-pay | Admitting: Family Medicine

## 2017-10-06 LAB — UA/M W/RFLX CULTURE, ROUTINE
Bilirubin, UA: NEGATIVE
Glucose, UA: NEGATIVE
Ketones, UA: NEGATIVE
Nitrite, UA: NEGATIVE
PH UA: 5.5 (ref 5.0–7.5)
PROTEIN UA: NEGATIVE
Specific Gravity, UA: 1.015 (ref 1.005–1.030)
UUROB: 0.2 mg/dL (ref 0.2–1.0)

## 2017-10-06 LAB — MICROSCOPIC EXAMINATION: RBC MICROSCOPIC, UA: NONE SEEN /HPF (ref 0–2)

## 2017-10-06 LAB — URINE CULTURE, REFLEX

## 2017-10-06 MED ORDER — ATORVASTATIN CALCIUM 10 MG PO TABS: 10.0000 mg | ORAL_TABLET | Freq: Every day | ORAL | 1 refills | Status: DC

## 2017-10-09 ENCOUNTER — Telehealth: Payer: Self-pay | Admitting: Family

## 2017-10-09 DIAGNOSIS — N390 Urinary tract infection, site not specified: Secondary | ICD-10-CM

## 2017-10-09 MED ORDER — CEPHALEXIN 500 MG PO CAPS: 500.0000 mg | ORAL_CAPSULE | Freq: Two times a day (BID) | ORAL | 0 refills | Status: DC

## 2017-10-09 NOTE — Progress Notes (Signed)

## 2017-10-23 ENCOUNTER — Other Ambulatory Visit: Payer: Self-pay | Admitting: Family Medicine

## 2017-10-23 ENCOUNTER — Encounter: Payer: Self-pay | Admitting: Family Medicine

## 2017-10-23 MED ORDER — ROSUVASTATIN CALCIUM 10 MG PO TABS: 10.0000 mg | ORAL_TABLET | Freq: Every day | ORAL | 1 refills | Status: DC

## 2017-12-08 ENCOUNTER — Telehealth: Payer: Self-pay | Admitting: Physician Assistant

## 2017-12-08 DIAGNOSIS — J029 Acute pharyngitis, unspecified: Secondary | ICD-10-CM

## 2017-12-08 MED ORDER — AMOXICILLIN 500 MG PO CAPS: 500.0000 mg | ORAL_CAPSULE | Freq: Two times a day (BID) | ORAL | 0 refills | Status: AC

## 2017-12-08 MED ORDER — BENZONATATE 100 MG PO CAPS: 100.0000 mg | ORAL_CAPSULE | Freq: Two times a day (BID) | ORAL | 0 refills | Status: DC | PRN

## 2017-12-08 MED ORDER — FLUTICASONE PROPIONATE 50 MCG/ACT NA SUSP: 2.0000 | Freq: Every day | NASAL | 0 refills | Status: DC

## 2017-12-08 NOTE — Progress Notes (Signed)
  We are sorry that you are not feeling well.  Here is how we plan to help!    Based on your presentation I believe you have pharyngitis. This may be viral, allergic, or bacterial in nature. I have prescribed Amoxicillin 500 mg one pill twice daily for 10 days. This covers for a bacterial infection in the throat, sinus, or ear.  Also your symptoms of nasal congestion, headache could be due to a developing sinus infection, and the prescribed antibiotic will help. Furthermore, it will help with any developing ear infection. If the nasal spray you used is Flonase, please continue taking it as this will help with any underlying allergies and the nasal congestion.  Also, I recommend taking a daily OTC Cetirizine 10 mg as that will help further will allergies.    If your symptoms persist or worsen, please follow up with [place].  In addition you may use A prescription cough medication called Tessalon Perles 100mg . You may take 1-2 capsules every 8 hours as needed for your cough.  Start with medication if you develop a cough.   Prednisone 5 mg daily for 6 days (see taper instructions below)  HOME CARE . Only take medications as instructed by your medical team. . Complete the entire course of an antibiotic. . Drink plenty of fluids and get plenty of rest. . Avoid close contacts especially the very young and the elderly . Cover your mouth if you cough or cough into your sleeve. . Always remember to wash your hands . A steam or ultrasonic humidifier can help congestion.   GET HELP RIGHT AWAY IF: . You develop worsening fever. . You become short of breath . You cough up blood. . Your symptoms persist after you have completed your treatment plan MAKE SURE YOU   Understand these instructions.  Will watch your condition.  Will get help right away if you are not doing well or get worse.  Your e-visit answers were reviewed by a board certified advanced clinical practitioner to complete your personal  care plan.  Depending on the condition, your plan could have included both over the counter or prescription medications. If there is a problem please reply  once you have received a response from your provider. Your safety is important to [place].  If you have drug allergies check your prescription carefully.    You can use MyChart to ask questions about today's visit, request a non-urgent call back, or ask for a work or school excuse for 24 hours related to this e-Visit. If it has been greater than 24 hours you will need to follow up with your provider, or enter a new e-Visit to address those concerns. You will get an e-mail in the next two days asking about your experience.  I hope that your e-visit has been valuable and will speed your recovery. Thank you for using e-visits.

## 2018-01-06 ENCOUNTER — Encounter: Payer: Self-pay | Admitting: Family Medicine

## 2018-01-09 ENCOUNTER — Other Ambulatory Visit: Payer: Self-pay | Admitting: Family Medicine

## 2018-01-09 DIAGNOSIS — R1310 Dysphagia, unspecified: Secondary | ICD-10-CM

## 2018-01-19 ENCOUNTER — Encounter: Payer: Self-pay | Admitting: *Deleted

## 2018-02-19 ENCOUNTER — Telehealth: Payer: Self-pay | Admitting: Family

## 2018-02-19 DIAGNOSIS — J029 Acute pharyngitis, unspecified: Secondary | ICD-10-CM

## 2018-02-19 MED ORDER — BENZONATATE 100 MG PO CAPS: 100.0000 mg | ORAL_CAPSULE | Freq: Three times a day (TID) | ORAL | 0 refills | Status: DC | PRN

## 2018-02-19 NOTE — Progress Notes (Addendum)
Addendum; sent pt additional info:  Ms. Beth James,   Your chief complaint was sore throat with cough and runny nose. This could indeed be a common cold as well. The treatment is the same with the exception of adding Flonase and waiting 7-10 days before considering antibiotics. I see you have this as an active prescription so I did not send more. After only 3 days without symptoms pointing to a bacterial infection, supportive care is the only treatment. The E-visit program uses evidence-based medicine for all of our care plans. Every now and then the evidence may not be what a patient prefers. Please know that you are getting the best standard of care, given the latest evidence, regardless of what care plans may or may not have been implemented in the past.  I hope you feel better soon,  Constance HawJohn Withrow, DNP, FNP-BC [facility] E[facility] Team

## 2018-02-19 NOTE — Progress Notes (Signed)
Thank you for the details you included in the comment boxes. Those details are very helpful in determining the best course of treatment for you and help us to provide the best care. See plan below; I have also sent Tessalon Perles 100mg , take 1 or 2 every 8 hours as needed for cough.   We are sorry that you are not feeling well.  Here is how we plan to help!  Your symptoms indicate a likely viral infection (Pharyngitis).   Pharyngitis is inflammation in the back of the throat which can cause a sore throat, scratchiness and sometimes difficulty swallowing.   Pharyngitis is typically caused by a respiratory virus and will just run its course.  Please keep in mind that your symptoms could last up to 10 days.  For throat pain, we recommend over the counter oral pain relief medications such as acetaminophen or aspirin, or anti-inflammatory medications such as ibuprofen or naproxen sodium.  Topical treatments such as oral throat lozenges or sprays may be used as needed.  Avoid close contact with loved ones, especially the very young and elderly.  Remember to wash your hands thoroughly throughout the day as this is the number one way to prevent the spread of infection and wipe down door knobs and counters with disinfectant.  After careful review of your answers, I would not recommend and antibiotic for your condition.  Antibiotics should not be used to treat conditions that we suspect are caused by viruses like the virus that causes the common cold or flu. However, some people can have Strep with atypical symptoms. You may need formal testing in clinic or office to confirm if your symptoms continue or worsen.  Providers prescribe antibiotics to treat infections caused by bacteria. Antibiotics are very powerful in treating bacterial infections when they are used properly.  To maintain their effectiveness, they should be used only when necessary.  Overuse of antibiotics has resulted in the development of super bugs  that are resistant to treatment!    Home Care:  Only take medications as instructed by your medical team.  Do not drink alcohol while taking these medications.  A steam or ultrasonic humidifier can help congestion.  You can place a towel over your head and breathe in the steam from hot water coming from a faucet.  Avoid close contacts especially the very young and the elderly.  Cover your mouth when you cough or sneeze.  Always remember to wash your hands.  Get Help Right Away If:  You develop worsening fever or throat pain.  You develop a severe head ache or visual changes.  Your symptoms persist after you have completed your treatment plan.  Make sure you  Understand these instructions.  Will watch your condition.  Will get help right away if you are not doing well or get worse.  Your e-visit answers were reviewed by a board certified advanced clinical practitioner to complete your personal care plan.  Depending on the condition, your plan could have included both over the counter or prescription medications.  If there is a problem please reply  once you have received a response from your provider.  Your safety is important to us.  If you have drug allergies check your prescription carefully.    You can use MyChart to ask questions about todays visit, request a non-urgent call back, or ask for a work or school excuse for 24 hours related to this e-Visit. If it has been greater than 24 hours you will  need to follow up with your provider, or enter a new e-Visit to address those concerns.  You will get an e-mail in the next two days asking about your experience.  I hope that your e-visit has been valuable and will speed your recovery. Thank you for using e-visits.

## 2018-02-21 ENCOUNTER — Encounter: Payer: Self-pay | Admitting: Family Medicine

## 2018-02-23 ENCOUNTER — Ambulatory Visit (INDEPENDENT_AMBULATORY_CARE_PROVIDER_SITE_OTHER): Payer: Managed Care, Other (non HMO) | Admitting: Family Medicine

## 2018-02-23 ENCOUNTER — Encounter: Payer: Self-pay | Admitting: Family Medicine

## 2018-02-23 VITALS — BP 152/82 | HR 88 | Temp 98.3°F | Wt 176.8 lb

## 2018-02-23 DIAGNOSIS — J069 Acute upper respiratory infection, unspecified: Secondary | ICD-10-CM | POA: Diagnosis not present

## 2018-02-23 MED ORDER — PREDNISONE 50 MG PO TABS: 50.0000 mg | ORAL_TABLET | Freq: Every day | ORAL | 0 refills | Status: DC

## 2018-02-23 MED ORDER — TRIAMCINOLONE ACETONIDE 40 MG/ML IJ SUSP
40.0000 mg | Freq: Once | INTRAMUSCULAR | Status: AC
  Administered 2018-02-23: 40 mg via INTRAMUSCULAR

## 2018-02-23 MED ORDER — HYDROCOD POLST-CPM POLST ER 10-8 MG/5ML PO SUER: 5.0000 mL | Freq: Every evening | ORAL | 0 refills | Status: DC | PRN

## 2018-02-23 NOTE — Progress Notes (Signed)
BP (!) 152/82   Pulse 88   Temp 98.3 F (36.8 C) (Oral)   Wt 176 lb 12.8 oz (80.2 kg)   LMP  (LMP Unknown)   SpO2 98%   BMI 35.71 kg/m    Subjective:    Patient ID: Beth James, female    DOB: [birth year], 62 y.o.   MRN: [id number]  HPI: ANEESHA James is a 62 y.o. female  Chief Complaint  Patient presents with  . URI    pt states she has had a cough, congestion, sinus pressure, and headache since 02/16/18. States she has tried multiple OTC meds with little to no relief   UPPER RESPIRATORY TRACT INFECTION Duration: 1 week Worst symptom: congestion and cough Fever: yes Cough: yes Shortness of breath: no Wheezing: no Chest pain: no Chest tightness: no Chest congestion: no Nasal congestion: yes Runny nose: yes Post nasal drip: yes Sneezing: yes Sore throat: no Swollen glands: no Sinus pressure: yes Headache: yes Face pain: no Toothache: no Ear pain: no  Ear pressure: no  Eyes red/itching:yes Eye drainage/crusting: yes  Vomiting: no Rash: no Fatigue: yes Sick contacts: no Strep contacts: no  Context: better Recurrent sinusitis: no Relief with OTC cold/cough medications: no  Treatments attempted: allegra, flonase, niquil, sudafed    Relevant past medical, surgical, family and social history reviewed and updated as indicated. Interim medical history since our last visit reviewed. Allergies and medications reviewed and updated.  Review of Systems  Constitutional: Positive for chills and fatigue. Negative for activity change, appetite change, diaphoresis, fever and unexpected weight change.  HENT: Positive for congestion, postnasal drip, rhinorrhea and sinus pressure. Negative for dental problem, drooling, ear discharge, ear pain, facial swelling, hearing loss, mouth sores, nosebleeds, sinus pain, sneezing, sore throat, tinnitus, trouble swallowing and voice change.   Eyes: Negative.   Respiratory: Positive for cough. Negative for apnea, choking, chest  tightness, shortness of breath, wheezing and stridor.   Cardiovascular: Negative.   Gastrointestinal: Negative.   Psychiatric/Behavioral: Negative.     Per HPI unless specifically indicated above     Objective:    BP (!) 152/82   Pulse 88   Temp 98.3 F (36.8 C) (Oral)   Wt 176 lb 12.8 oz (80.2 kg)   LMP  (LMP Unknown)   SpO2 98%   BMI 35.71 kg/m   Wt Readings from Last 3 Encounters:  02/23/18 176 lb 12.8 oz (80.2 kg)  10/03/17 171 lb (77.6 kg)  01/03/17 176 lb (79.8 kg)    Physical Exam Vitals signs and nursing note reviewed.  Constitutional:      General: She is not in acute distress.    Appearance: Normal appearance. She is not ill-appearing, toxic-appearing or diaphoretic.  HENT:     Head: Normocephalic and atraumatic.     Right Ear: Tympanic membrane, ear canal and external ear normal. There is no impacted cerumen.     Left Ear: Tympanic membrane, ear canal and external ear normal. There is no impacted cerumen.     Nose: Congestion and rhinorrhea present.     Mouth/Throat:     Mouth: Mucous membranes are moist.     Pharynx: Oropharynx is clear. No oropharyngeal exudate or posterior oropharyngeal erythema.  Eyes:     General: No scleral icterus.       Right eye: No discharge.        Left eye: No discharge.     Extraocular Movements: Extraocular movements intact.     Conjunctiva/sclera: Conjunctivae  normal.     Pupils: Pupils are equal, round, and reactive to light.  Neck:     Musculoskeletal: Normal range of motion and neck supple. No neck rigidity or muscular tenderness.     Vascular: No carotid bruit.  Cardiovascular:     Rate and Rhythm: Normal rate and regular rhythm.     Pulses: Normal pulses.     Heart sounds: Normal heart sounds. No murmur. No friction rub. No gallop.   Pulmonary:     Effort: Pulmonary effort is normal. No respiratory distress.     Breath sounds: No stridor. Wheezing present. No rhonchi or rales.  Chest:     Chest wall: No  tenderness.  Musculoskeletal: Normal range of motion.  Lymphadenopathy:     Cervical: Cervical adenopathy present.  Skin:    General: Skin is warm and dry.     Capillary Refill: Capillary refill takes less than 2 seconds.     Coloration: Skin is not jaundiced or pale.     Findings: No bruising, erythema, lesion or rash.  Neurological:     General: No focal deficit present.     Mental Status: She is alert and oriented to person, place, and time. Mental status is at baseline.     Cranial Nerves: No cranial nerve deficit.     Sensory: No sensory deficit.     Motor: No weakness.     Coordination: Coordination normal.     Gait: Gait normal.     Deep Tendon Reflexes: Reflexes normal.  Psychiatric:        Mood and Affect: Mood normal.        Behavior: Behavior normal.        Thought Content: Thought content normal.        Judgment: Judgment normal.     Results for orders placed or performed in visit on 10/03/17  Urine Culture  Result Value Ref Range   Urine Culture, Routine Final report    Organism ID, Bacteria Comment   Microscopic Examination  Result Value Ref Range   WBC, UA 6-10 (A) 0 - 5 /hpf   RBC, UA None seen 0 - 2 /hpf   Epithelial Cells (non renal) 0-10 0 - 10 /hpf   Bacteria, UA Moderate (A) None seen/Few  Urine Culture, Reflex  Result Value Ref Range   Urine Culture, Routine WILL FOLLOW   CBC with Differential/Platelet  Result Value Ref Range   WBC 6.0 3.4 - 10.8 x10E3/uL   RBC 4.66 3.77 - 5.28 x10E6/uL   Hemoglobin 13.6 11.1 - 15.9 g/dL   Hematocrit 16.140.7 09.634.0 - 46.6 %   MCV 87 79 - 97 fL   MCH 29.2 26.6 - 33.0 pg   MCHC 33.4 31.5 - 35.7 g/dL   RDW 04.514.5 40.912.3 - 81.115.4 %   Platelets 368 150 - 450 x10E3/uL   Neutrophils 55 Not Estab. %   Lymphs 34 Not Estab. %   Monocytes 9 Not Estab. %   Eos 1 Not Estab. %   Basos 1 Not Estab. %   Neutrophils Absolute 3.4 1.4 - 7.0 x10E3/uL   Lymphocytes Absolute 2.0 0.7 - 3.1 x10E3/uL   Monocytes Absolute 0.5 0.1 - 0.9  x10E3/uL   EOS (ABSOLUTE) 0.0 0.0 - 0.4 x10E3/uL   Basophils Absolute 0.0 0.0 - 0.2 x10E3/uL   Immature Granulocytes 0 Not Estab. %   Immature Grans (Abs) 0.0 0.0 - 0.1 x10E3/uL  Comprehensive metabolic panel  Result Value Ref Range   Glucose  84 65 - 99 mg/dL   BUN 17 8 - 27 mg/dL   Creatinine, Ser 1.610.80 0.57 - 1.00 mg/dL   GFR calc non Af Amer 79 >59 mL/min/1.73   GFR calc Af Amer 91 >59 mL/min/1.73   BUN/Creatinine Ratio 21 12 - 28   Sodium 141 134 - 144 mmol/L   Potassium 4.2 3.5 - 5.2 mmol/L   Chloride 103 96 - 106 mmol/L   CO2 24 20 - 29 mmol/L   Calcium 9.8 8.7 - 10.3 mg/dL   Total Protein 6.7 6.0 - 8.5 g/dL   Albumin 4.4 3.6 - 4.8 g/dL   Globulin, Total 2.3 1.5 - 4.5 g/dL   Albumin/Globulin Ratio 1.9 1.2 - 2.2   Bilirubin Total 0.3 0.0 - 1.2 mg/dL   Alkaline Phosphatase 81 39 - 117 IU/L   AST 11 0 - 40 IU/L   ALT 12 0 - 32 IU/L  Lipid Panel w/o Chol/HDL Ratio  Result Value Ref Range   Cholesterol, Total 211 (H) 100 - 199 mg/dL   Triglycerides 096127 0 - 149 mg/dL   HDL 47 >04>39 mg/dL   VLDL Cholesterol Cal 25 5 - 40 mg/dL   LDL Calculated 540139 (H) 0 - 99 mg/dL  UA/M w/rflx Culture, Routine  Result Value Ref Range   Specific Gravity, UA 1.015 1.005 - 1.030   pH, UA 5.5 5.0 - 7.5   Color, UA Yellow Yellow   Appearance Ur Cloudy (A) Clear   Leukocytes, UA 2+ (A) Negative   Protein, UA Negative Negative/Trace   Glucose, UA Negative Negative   Ketones, UA Negative Negative   RBC, UA Trace (A) Negative   Bilirubin, UA Negative Negative   Urobilinogen, Ur 0.2 0.2 - 1.0 mg/dL   Nitrite, UA Negative Negative   Microscopic Examination See below:   TSH  Result Value Ref Range   TSH 2.050 0.450 - 4.500 uIU/mL  HIV antibody  Result Value Ref Range   HIV Screen 4th Generation wRfx Non Reactive Non Reactive  Hepatitis C antibody  Result Value Ref Range   Hep C Virus Ab <0.1 0.0 - 0.9 s/co ratio      Assessment & Plan:   Problem List Items Addressed This Visit    None     Visit Diagnoses    Viral upper respiratory tract infection    -  Primary   No sign of bacterial infection. Will treat with kenalog shot today and prednisone tomorrow and tussionex for comfort. Call with any concerns or if not better.    Relevant Medications   triamcinolone acetonide (KENALOG-40) injection 40 mg (Start on 02/23/2018 11:00 AM)       Follow up plan: Return if symptoms worsen or fail to improve.

## 2018-03-03 ENCOUNTER — Encounter: Payer: Self-pay | Admitting: Family Medicine

## 2018-03-04 ENCOUNTER — Other Ambulatory Visit: Payer: Self-pay | Admitting: Family Medicine

## 2018-03-04 ENCOUNTER — Encounter: Payer: Self-pay | Admitting: Family Medicine

## 2018-03-04 MED ORDER — AMOXICILLIN-POT CLAVULANATE 875-125 MG PO TABS: 1.0000 | ORAL_TABLET | Freq: Two times a day (BID) | ORAL | 0 refills | Status: DC

## 2018-04-07 ENCOUNTER — Ambulatory Visit: Payer: Managed Care, Other (non HMO) | Admitting: Family Medicine

## 2018-04-07 ENCOUNTER — Encounter: Payer: Self-pay | Admitting: Family Medicine

## 2018-04-07 VITALS — BP 148/78 | HR 94 | Temp 97.9°F | Ht 59.0 in | Wt 179.8 lb

## 2018-04-07 DIAGNOSIS — Z6836 Body mass index (BMI) 36.0-36.9, adult: Secondary | ICD-10-CM | POA: Diagnosis not present

## 2018-04-07 DIAGNOSIS — E782 Mixed hyperlipidemia: Secondary | ICD-10-CM | POA: Diagnosis not present

## 2018-04-07 DIAGNOSIS — R030 Elevated blood-pressure reading, without diagnosis of hypertension: Secondary | ICD-10-CM

## 2018-04-07 NOTE — Progress Notes (Signed)
BP (!) 148/78   Pulse 94   Temp 97.9 F (36.6 C) (Oral)   Ht 4\' 11"  (1.499 m)   Wt 179 lb 12.8 oz (81.6 kg)   LMP  (LMP Unknown)   SpO2 99%   BMI 36.32 kg/m    Subjective:    Patient ID: Beth James, female    DOB: [birth year], 63 y.o.   MRN: [id number]  HPI: Beth James is a 63 y.o. female  Chief Complaint  Patient presents with  . Hypertension    6 month F/U  . Hyperlipidemia    Patient would like cholesterol checked   Here today for 6 month BP and chol f/u.   Has been working on diet and exercise modifications to try and avoid starting a BP medication. Does not check BPs at home. Denies CP, SOB, HAs, dizziness.   Currently on crestor for cholesterol management and tolerating well. Does not like taking a chol medication and considering stopping. Wanting to be on as few medicines as possible. Denies claudication, myalgias.   Relevant past medical, surgical, family and social history reviewed and updated as indicated. Interim medical history since our last visit reviewed. Allergies and medications reviewed and updated.  Review of Systems  Per HPI unless specifically indicated above     Objective:    BP (!) 148/78   Pulse 94   Temp 97.9 F (36.6 C) (Oral)   Ht 4\' 11"  (1.499 m)   Wt 179 lb 12.8 oz (81.6 kg)   LMP  (LMP Unknown)   SpO2 99%   BMI 36.32 kg/m   Wt Readings from Last 3 Encounters:  04/07/18 179 lb 12.8 oz (81.6 kg)  02/23/18 176 lb 12.8 oz (80.2 kg)  10/03/17 171 lb (77.6 kg)    Physical Exam Vitals signs and nursing note reviewed.  Constitutional:      Appearance: Normal appearance. She is not ill-appearing.  HENT:     Head: Atraumatic.  Eyes:     Extraocular Movements: Extraocular movements intact.     Conjunctiva/sclera: Conjunctivae normal.  Neck:     Musculoskeletal: Normal range of motion and neck supple.  Cardiovascular:     Rate and Rhythm: Normal rate and regular rhythm.     Heart sounds: Normal heart sounds.    Pulmonary:     Effort: Pulmonary effort is normal.     Breath sounds: Normal breath sounds.  Musculoskeletal: Normal range of motion.  Skin:    General: Skin is warm and dry.  Neurological:     Mental Status: She is alert and oriented to person, place, and time.  Psychiatric:        Mood and Affect: Mood normal.        Thought Content: Thought content normal.        Judgment: Judgment normal.     Results for orders placed or performed in visit on 04/07/18  Lipid Panel w/o Chol/HDL Ratio  Result Value Ref Range   Cholesterol, Total 188 100 - 199 mg/dL   Triglycerides 765 0 - 149 mg/dL   HDL 55 >46 mg/dL   VLDL Cholesterol Cal 25 5 - 40 mg/dL   LDL Calculated 503 (H) 0 - 99 mg/dL  Comprehensive metabolic panel  Result Value Ref Range   Glucose 93 65 - 99 mg/dL   BUN 17 8 - 27 mg/dL   Creatinine, Ser 5.46 0.57 - 1.00 mg/dL   GFR calc non Af Amer 84 >59 mL/min/1.73  GFR calc Af Amer 97 >59 mL/min/1.73   BUN/Creatinine Ratio 22 12 - 28   Sodium 142 134 - 144 mmol/L   Potassium 4.7 3.5 - 5.2 mmol/L   Chloride 103 96 - 106 mmol/L   CO2 25 20 - 29 mmol/L   Calcium 10.1 8.7 - 10.3 mg/dL   Total Protein 6.9 6.0 - 8.5 g/dL   Albumin 4.5 3.8 - 4.8 g/dL   Globulin, Total 2.4 1.5 - 4.5 g/dL   Albumin/Globulin Ratio 1.9 1.2 - 2.2   Bilirubin Total 0.4 0.0 - 1.2 mg/dL   Alkaline Phosphatase 87 39 - 117 IU/L   AST 13 0 - 40 IU/L   ALT 13 0 - 32 IU/L      Assessment & Plan:   Problem List Items Addressed This Visit      Other   Obesity    Continue working on lifestyle modifications      Hyperlipidemia - Primary    Will recheck lipids and she will decide from there if she would like to continue statin therapy. Continue working on lifestyle modifications      Relevant Orders   Lipid Panel w/o Chol/HDL Ratio (Completed)   Elevated BP without diagnosis of hypertension    BPs consistently above goal, recommended again starting low dose BP medication. Pt declines, wanting to  continue working on lifestyle modifications for control. DASH diet and exercise discussed. Will recheck at next OV and revisit.       Relevant Orders   Comprehensive metabolic panel (Completed)       Follow up plan: Return in about 6 months (around 10/06/2018) for CPE.

## 2018-04-08 LAB — COMPREHENSIVE METABOLIC PANEL
A/G RATIO: 1.9 (ref 1.2–2.2)
ALT: 13 IU/L (ref 0–32)
AST: 13 IU/L (ref 0–40)
Albumin: 4.5 g/dL (ref 3.8–4.8)
Alkaline Phosphatase: 87 IU/L (ref 39–117)
BUN/Creatinine Ratio: 22 (ref 12–28)
BUN: 17 mg/dL (ref 8–27)
Bilirubin Total: 0.4 mg/dL (ref 0.0–1.2)
CO2: 25 mmol/L (ref 20–29)
Calcium: 10.1 mg/dL (ref 8.7–10.3)
Chloride: 103 mmol/L (ref 96–106)
Creatinine, Ser: 0.76 mg/dL (ref 0.57–1.00)
GFR calc Af Amer: 97 mL/min/{1.73_m2} (ref >59)
GFR calc non Af Amer: 84 mL/min/{1.73_m2} (ref >59)
Globulin, Total: 2.4 g/dL (ref 1.5–4.5)
Glucose: 93 mg/dL (ref 65–99)
POTASSIUM: 4.7 mmol/L (ref 3.5–5.2)
Sodium: 142 mmol/L (ref 134–144)
Total Protein: 6.9 g/dL (ref 6.0–8.5)

## 2018-04-08 LAB — LIPID PANEL W/O CHOL/HDL RATIO
CHOLESTEROL TOTAL: 188 mg/dL (ref 100–199)
HDL: 55 mg/dL (ref >39)
LDL Calculated: 108 mg/dL — ABNORMAL HIGH (ref 0–99)
Triglycerides: 125 mg/dL (ref 0–149)
VLDL Cholesterol Cal: 25 mg/dL (ref 5–40)

## 2018-04-13 DIAGNOSIS — R030 Elevated blood-pressure reading, without diagnosis of hypertension: Secondary | ICD-10-CM

## 2018-04-13 DIAGNOSIS — I10 Essential (primary) hypertension: Secondary | ICD-10-CM | POA: Insufficient documentation

## 2018-04-13 NOTE — Assessment & Plan Note (Signed)
Continue working on lifestyle modifications 

## 2018-04-13 NOTE — Assessment & Plan Note (Signed)
Will recheck lipids and she will decide from there if she would like to continue statin therapy. Continue working on lifestyle modifications

## 2018-04-13 NOTE — Assessment & Plan Note (Signed)
BPs consistently above goal, recommended again starting low dose BP medication. Pt declines, wanting to continue working on lifestyle modifications for control. DASH diet and exercise discussed. Will recheck at next OV and revisit.

## 2018-04-16 ENCOUNTER — Encounter: Payer: Self-pay | Admitting: Family Medicine

## 2018-04-17 ENCOUNTER — Other Ambulatory Visit: Payer: Self-pay | Admitting: Family Medicine

## 2018-04-17 MED ORDER — LISINOPRIL 5 MG PO TABS: 5.0000 mg | ORAL_TABLET | Freq: Every day | ORAL | 0 refills | Status: DC

## 2018-10-07 ENCOUNTER — Other Ambulatory Visit: Payer: Self-pay | Admitting: Family Medicine

## 2018-10-07 ENCOUNTER — Telehealth: Payer: Self-pay | Admitting: Family Medicine

## 2018-10-07 ENCOUNTER — Encounter: Payer: Managed Care, Other (non HMO) | Admitting: Family Medicine

## 2018-10-07 NOTE — Telephone Encounter (Signed)
Copied from [place] [phone]. Topic: General - Other >> Oct 07, 2018  8:54 AM Leward Quan A wrote: Reason for CRM: Patient called to say that due to an issue out of her control she was not able to make her appointment at 9.00 am she need to reschedule please call patient at Ph# [phone]    Called pt to reschedule no answer left vm

## 2018-10-07 NOTE — Telephone Encounter (Signed)
This is not on her current med list. Please advise

## 2018-10-08 MED ORDER — ROSUVASTATIN CALCIUM 10 MG PO TABS: 10.0000 mg | ORAL_TABLET | Freq: Every day | ORAL | 0 refills | Status: DC

## 2018-10-08 NOTE — Telephone Encounter (Signed)
Reordered crestor for 1 month, which is the one I have on file that she's on. Denied lipitor. Per record review, she is significantly overdue for follow up (had asked her to come back in March to recheck BP with new start medication) and missed her appt yesterday and rescheduled for October. Please let her know that she will need to have an appt to receive any further refills

## 2018-10-08 NOTE — Telephone Encounter (Signed)
Called pt told her that Apolonio Schneiders filled prescription and that she would need an appt for further refills. Pt states that her next appt will be physical in October.

## 2018-10-13 ENCOUNTER — Other Ambulatory Visit: Payer: Self-pay | Admitting: Family Medicine

## 2018-10-13 MED ORDER — LISINOPRIL 5 MG PO TABS: 5.0000 mg | ORAL_TABLET | Freq: Every day | ORAL | 1 refills | Status: DC

## 2018-10-13 NOTE — Telephone Encounter (Signed)
Routing to provider  

## 2018-10-15 ENCOUNTER — Telehealth: Payer: Self-pay | Admitting: Family Medicine

## 2018-10-15 NOTE — Telephone Encounter (Signed)
Routing to provider  

## 2018-10-15 NOTE — Telephone Encounter (Signed)
Pt called in to request a refill for her  atorvastatin (LIPITOR) 10 MG tablet. Pt says that she prefer this medication instead, it helps better.   Pt says that she also doesn't take lisinopril (ZESTRIL) 5 MG tablet      Pharmacy:  [id number] [address], [place] [phone] (Phone) [phone] (Fax)

## 2018-10-16 NOTE — Telephone Encounter (Signed)
Needs appointment

## 2018-10-16 NOTE — Telephone Encounter (Signed)
Lvm for pt to call back. 

## 2018-10-19 NOTE — Telephone Encounter (Signed)
Called and left a detailed message for patient.  

## 2018-10-19 NOTE — Telephone Encounter (Signed)
The efficacy of these two are comparable, and I have no record of her being on lipitor at least prescribed by me.

## 2018-12-02 ENCOUNTER — Ambulatory Visit (INDEPENDENT_AMBULATORY_CARE_PROVIDER_SITE_OTHER): Payer: Managed Care, Other (non HMO) | Admitting: Family Medicine

## 2018-12-02 ENCOUNTER — Encounter: Payer: Self-pay | Admitting: Family Medicine

## 2018-12-02 ENCOUNTER — Other Ambulatory Visit: Payer: Self-pay

## 2018-12-02 VITALS — BP 146/79 | HR 98 | Temp 98.4°F | Ht 59.0 in | Wt 184.2 lb

## 2018-12-02 DIAGNOSIS — J309 Allergic rhinitis, unspecified: Secondary | ICD-10-CM | POA: Diagnosis not present

## 2018-12-02 DIAGNOSIS — F419 Anxiety disorder, unspecified: Secondary | ICD-10-CM

## 2018-12-02 DIAGNOSIS — Z124 Encounter for screening for malignant neoplasm of cervix: Secondary | ICD-10-CM | POA: Diagnosis not present

## 2018-12-02 DIAGNOSIS — E782 Mixed hyperlipidemia: Secondary | ICD-10-CM

## 2018-12-02 DIAGNOSIS — K219 Gastro-esophageal reflux disease without esophagitis: Secondary | ICD-10-CM | POA: Diagnosis not present

## 2018-12-02 DIAGNOSIS — I10 Essential (primary) hypertension: Secondary | ICD-10-CM | POA: Diagnosis not present

## 2018-12-02 DIAGNOSIS — Z0000 Encounter for general adult medical examination without abnormal findings: Secondary | ICD-10-CM

## 2018-12-02 DIAGNOSIS — R42 Dizziness and giddiness: Secondary | ICD-10-CM

## 2018-12-02 DIAGNOSIS — Z1231 Encounter for screening mammogram for malignant neoplasm of breast: Secondary | ICD-10-CM | POA: Diagnosis not present

## 2018-12-02 MED ORDER — AMLODIPINE BESYLATE 5 MG PO TABS: 5.0000 mg | ORAL_TABLET | Freq: Every day | ORAL | 0 refills | Status: DC

## 2018-12-02 MED ORDER — ATORVASTATIN CALCIUM 20 MG PO TABS: 20.0000 mg | ORAL_TABLET | Freq: Every day | ORAL | 1 refills | Status: DC

## 2018-12-02 MED ORDER — SERTRALINE HCL 25 MG PO TABS: 25.0000 mg | ORAL_TABLET | Freq: Every day | ORAL | 0 refills | Status: DC

## 2018-12-02 NOTE — Patient Instructions (Addendum)
Please call [facility] to schedule your Mammogram and Bone Density Scan.  [place], [place] [address] [phone]   How to Perform the Epley Maneuver The Epley maneuver is an exercise that relieves symptoms of vertigo. Vertigo is the feeling that you or your surroundings are moving when they are not. When you feel vertigo, you may feel like the room is spinning and have trouble walking. Dizziness is a little different than vertigo. When you are dizzy, you may feel unsteady or light-headed. You can do this maneuver at home whenever you have symptoms of vertigo. You can do it up to 3 times a day until your symptoms go away. Even though the Epley maneuver may relieve your vertigo for a few weeks, it is possible that your symptoms will return. This maneuver relieves vertigo, but it does not relieve dizziness. What are the risks? If it is done correctly, the Epley maneuver is considered safe. Sometimes it can lead to dizziness or nausea that goes away after a short time. If you develop other symptoms, such as changes in vision, weakness, or numbness, stop doing the maneuver and call your health care provider. How to perform the Epley maneuver 1. Sit on the edge of a bed or table with your back straight and your legs extended or hanging over the edge of the bed or table. 2. Turn your head halfway toward the affected ear or side. 3. Lie backward quickly with your head turned until you are lying flat on your back. You may want to position a pillow under your shoulders. 4. Hold this position for 30 seconds. You may experience an attack of vertigo. This is normal. 5. Turn your head to the opposite direction until your unaffected ear is facing the floor. 6. Hold this position for 30 seconds. You may experience an attack of vertigo. This is normal. Hold this position until the vertigo stops. 7. Turn your whole body to the same side as your head. Hold for another 30  seconds. 8. Sit back up. You can repeat this exercise up to 3 times a day. Follow these instructions at home:  After doing the Epley maneuver, you can return to your normal activities.  Ask your health care provider if there is anything you should do at home to prevent vertigo. He or she may recommend that you: ? Keep your head raised (elevated) with two or more pillows while you sleep. ? Do not sleep on the side of your affected ear. ? Get up slowly from bed. ? Avoid sudden movements during the day. ? Avoid extreme head movement, like looking up or bending over. Contact a health care provider if:  Your vertigo gets worse.  You have other symptoms, including: ? Nausea. ? Vomiting. ? Headache. Get help right away if:  You have vision changes.  You have a severe or worsening headache or neck pain.  You cannot stop vomiting.  You have new numbness or weakness in any part of your body. Summary  Vertigo is the feeling that you or your surroundings are moving when they are not.  The Epley maneuver is an exercise that relieves symptoms of vertigo.  If the Epley maneuver is done correctly, it is considered safe. You can do it up to 3 times a day. This information is not intended to replace advice given to you by your health care provider. Make sure you discuss any questions you have with your health care provider. Document Released: 02/16/2013  Document Revised: 01/24/2017 Document Reviewed: 01/02/2016 Elsevier Patient Education  2020 [employer].

## 2018-12-02 NOTE — Assessment & Plan Note (Signed)
Stable and under good control with prn prilosec. Continue current regimen

## 2018-12-02 NOTE — Assessment & Plan Note (Signed)
Intolerant to lisinopril, will add low dose amlodipine and recheck in 1 month. Continue home monitoring, DASH diet, exercise

## 2018-12-02 NOTE — Progress Notes (Signed)
BP (!) 146/79 (BP Location: Left Arm, Patient Position: Sitting, Cuff Size: Normal)   Pulse 98   Temp 98.4 F (36.9 C) (Oral)   Ht 4\' 11"  (1.499 m)   Wt 184 lb 3.2 oz (83.6 kg)   LMP  (LMP Unknown)   SpO2 98%   BMI 37.20 kg/m    Subjective:    Patient ID: Beth James, female    DOB: [birth year], 63 y.o.   MRN: [id number]  HPI: Beth James is a 63 y.o. female presenting on 12/02/2018 for comprehensive medical examination. Current medical complaints include:see below  Feeling more anxious than usual, mostly with driving or working. Lots of stressors currently. Tried xanax prn in the past for breakthrough anxiety but did not have much benefit from that. Denies severe sxs, SI/HI, mood concerns. Has never tried anything else for anxiety in the past.   Dizziness when she first sits up out of bed in the mornings, and when moving her head quickly. Ear pressure b/l, feels like there's water in there when she sticks her finger in her ears. Takes flonase nightly and antihistamines sometimes for her known allergic rhinitis. No known hx of vertigo, tinnitus, headaches, hearing loss.   HTN - home readings 140s/70s-80s. Cannot tolerate lisinopril due to myalgias so has stopped it months ago. Denies CP, SOB, HAs, dizziness.   HLD - currently alternating between crestor and lipitor because she states for some reason the pharmacy will fill different ones sometimes. Tolerates both fairly well but prefers lipitor.   Taking prilosec prn for GERD sxs.   She currently lives with: Menopausal Symptoms: no  Depression Screen done today and results listed below:  Depression screen [facility] 2/9 10/03/2017 12/03/2016 09/03/2016 08/15/2015  Decreased Interest 0 0 0 0  Down, Depressed, Hopeless 0 0 0 0  PHQ - 2 Score 0 0 0 0  Altered sleeping 0 1 - -  Tired, decreased energy 1 0 - -  Change in appetite 0 0 - -  Feeling bad or failure about yourself  0 0 - -  Trouble concentrating 0 0 - -  Moving slowly or  fidgety/restless 0 0 - -  Suicidal thoughts 0 0 - -  PHQ-9 Score 1 1 - -    The patient does not have a history of falls. I did complete a risk assessment for falls. A plan of care for falls was documented.   Past Medical History:  No past medical history on file.  Surgical History:  Past Surgical History:  Procedure Laterality Date  . bone spur removal     left shoulder  . cartilage removal     left knee  . Clogged Tear Duct Stint placed      Medications:  Current Outpatient Medications on File Prior to Visit  Medication Sig  . CALCIUM-VITAMIN D PO Take by mouth.  . fluticasone (FLONASE) 50 MCG/ACT nasal spray Place 2 sprays into both nostrils daily.  . naproxen sodium (ALEVE) 220 MG tablet Take 440 mg by mouth at bedtime.  Marland Kitchen omeprazole (PRILOSEC OTC) 20 MG tablet Take 20 mg by mouth daily.   No current facility-administered medications on file prior to visit.     Allergies:  Allergies  Allergen Reactions  . Codeine Other (See Comments)    jittery  . Lisinopril Other (See Comments)    myalgias  . Tape Rash    Needs to use paper tape    Social History:  Social History   Socioeconomic  History  . Marital status: Married    Spouse name: Not on file  . Number of children: Not on file  . Years of education: Not on file  . Highest education level: Not on file  Occupational History  . Not on file  Social Needs  . Financial resource strain: Not on file  . Food insecurity    Worry: Not on file    Inability: Not on file  . Transportation needs    Medical: Not on file    Non-medical: Not on file  Tobacco Use  . Smoking status: Never Smoker  . Smokeless tobacco: Never Used  Substance and Sexual Activity  . Alcohol use: No    Alcohol/week: 0.0 standard drinks  . Drug use: No  . Sexual activity: Yes  Lifestyle  . Physical activity    Days per week: Not on file    Minutes per session: Not on file  . Stress: Not on file  Relationships  . Social Wellsite geologist on phone: Not on file    Gets together: Not on file    Attends religious service: Not on file    Active member of club or organization: Not on file    Attends meetings of clubs or organizations: Not on file    Relationship status: Not on file  . Intimate partner violence    Fear of current or ex partner: Not on file    Emotionally abused: Not on file    Physically abused: Not on file    Forced sexual activity: Not on file  Other Topics Concern  . Not on file  Social History Narrative  . Not on file   Social History   Tobacco Use  Smoking Status Never Smoker  Smokeless Tobacco Never Used   Social History   Substance and Sexual Activity  Alcohol Use No  . Alcohol/week: 0.0 standard drinks    Family History:  Family History  Problem Relation Age of Onset  . Heart disease Mother        CHF  . Heart disease Father   . Cancer Father        lung  . Heart disease Maternal Grandmother        CHF  . Cancer Other        colon    Past medical history, surgical history, medications, allergies, family history and social history reviewed with patient today and changes made to appropriate areas of the chart.   Review of Systems - General ROS: negative Psychological ROS: positive for - anxiety Ophthalmic ROS: negative ENT ROS: negative Allergy and Immunology ROS: negative Hematological and Lymphatic ROS: negative Endocrine ROS: negative Breast ROS: negative for breast lumps Respiratory ROS: no cough, shortness of breath, or wheezing Cardiovascular ROS: no chest pain or dyspnea on exertion Gastrointestinal ROS: no abdominal pain, change in bowel habits, or black or bloody stools Genito-Urinary ROS: no dysuria, trouble voiding, or hematuria Musculoskeletal ROS: negative Neurological ROS: no TIA or stroke symptoms Dermatological ROS: negative All other ROS negative except what is listed above and in the HPI.      Objective:    BP (!) 146/79 (BP Location: Left  Arm, Patient Position: Sitting, Cuff Size: Normal)   Pulse 98   Temp 98.4 F (36.9 C) (Oral)   Ht  (1.499 m)   Wt 184 lb 3.2 oz (83.6 kg)   LMP  (LMP Unknown)   SpO2 98%   BMI 37.20 kg/m  Wt Readings from Last 3 Encounters:  12/02/18 184 lb 3.2 oz (83.6 kg)  04/07/18 179 lb 12.8 oz (81.6 kg)  02/23/18 176 lb 12.8 oz (80.2 kg)    Physical Exam Vitals signs and nursing note reviewed.  Constitutional:      General: She is not in acute distress.    Appearance: She is well-developed.  HENT:     Head: Atraumatic.     Right Ear: External ear normal.     Left Ear: External ear normal.     Nose: Nose normal.     Mouth/Throat:     Pharynx: No oropharyngeal exudate.  Eyes:     General: No scleral icterus.    Conjunctiva/sclera: Conjunctivae normal.     Pupils: Pupils are equal, round, and reactive to light.  Neck:     Musculoskeletal: Normal range of motion and neck supple.     Thyroid: No thyromegaly.  Cardiovascular:     Rate and Rhythm: Normal rate and regular rhythm.     Heart sounds: Normal heart sounds.  Pulmonary:     Effort: Pulmonary effort is normal. No respiratory distress.     Breath sounds: Normal breath sounds.  Chest:     Breasts:        Right: No mass, skin change or tenderness.        Left: No mass, skin change or tenderness.  Abdominal:     General: Bowel sounds are normal.     Palpations: Abdomen is soft. There is no mass.     Tenderness: There is no abdominal tenderness.  Genitourinary:    Comments: GU exam declined Musculoskeletal: Normal range of motion.        General: No tenderness.  Lymphadenopathy:     Cervical: No cervical adenopathy.     Upper Body:     Right upper body: No axillary adenopathy.     Left upper body: No axillary adenopathy.  Skin:    General: Skin is warm and dry.     Findings: No rash.  Neurological:     Mental Status: She is alert and oriented to person, place, and time.     Cranial Nerves: No cranial nerve  deficit.  Psychiatric:        Behavior: Behavior normal.     Results for orders placed or performed in visit on 04/07/18  Lipid Panel w/o Chol/HDL Ratio  Result Value Ref Range   Cholesterol, Total 188 100 - 199 mg/dL   Triglycerides 161 0 - 149 mg/dL   HDL 55 >09 mg/dL   VLDL Cholesterol Cal 25 5 - 40 mg/dL   LDL Calculated 604 (H) 0 - 99 mg/dL  Comprehensive metabolic panel  Result Value Ref Range   Glucose 93 65 - 99 mg/dL   BUN 17 8 - 27 mg/dL   Creatinine, Ser 5.40 0.57 - 1.00 mg/dL   GFR calc non Af Amer 84 >59 mL/min/1.73   GFR calc Af Amer 97 >59 mL/min/1.73   BUN/Creatinine Ratio 22 12 - 28   Sodium 142 134 - 144 mmol/L   Potassium 4.7 3.5 - 5.2 mmol/L   Chloride 103 96 - 106 mmol/L   CO2 25 20 - 29 mmol/L   Calcium 10.1 8.7 - 10.3 mg/dL   Total Protein 6.9 6.0 - 8.5 g/dL   Albumin 4.5 3.8 - 4.8 g/dL   Globulin, Total 2.4 1.5 - 4.5 g/dL   Albumin/Globulin Ratio 1.9 1.2 - 2.2   Bilirubin Total 0.4 0.0 -  1.2 mg/dL   Alkaline Phosphatase 87 39 - 117 IU/L   AST 13 0 - 40 IU/L   ALT 13 0 - 32 IU/L      Assessment & Plan:   Problem List Items Addressed This Visit      Cardiovascular and Mediastinum   Essential hypertension - Primary    Intolerant to lisinopril, will add low dose amlodipine and recheck in 1 month. Continue home monitoring, DASH diet, exercise      Relevant Medications   atorvastatin (LIPITOR) 20 MG tablet   amLODipine (NORVASC) 5 MG tablet   Other Relevant Orders   CBC with Differential/Platelet   TSH   UA/M w/rflx Culture, Routine     Respiratory   Rhinitis, allergic    Increase flonase to BID, take antihistamine daily.        Digestive   Gastroesophageal reflux disease without esophagitis    Stable and under good control with prn prilosec. Continue current regimen        Other   Anxiety    Did not have much benefit with rare prn xanax in the past, open to starting zoloft low dose daily. Will recheck in 1 month. Discussed  breathing exercises to help in panic episodes      Relevant Medications   sertraline (ZOLOFT) 25 MG tablet   Hyperlipidemia    Reports doing better on lipitor so will switch over to lipitor. Continue working on diet and exercise, recheck lipids today      Relevant Medications   atorvastatin (LIPITOR) 20 MG tablet   amLODipine (NORVASC) 5 MG tablet   Other Relevant Orders   Lipid Panel w/o Chol/HDL Ratio   Comprehensive metabolic panel    Other Visit Diagnoses    Annual physical exam       Vertigo       Sit up from bed slowly, avoid triggering movements. Epley maneuvers given. Flonase BID in case inner ear pressure exacerbating issue   Breast cancer screening by mammogram       Relevant Orders   MM Digital Screening   Cervical cancer screening           Follow up plan: Return in about 4 weeks (around 12/30/2018) for Anxiety, BP f/u.   LABORATORY TESTING:  - Pap smear: up to date  IMMUNIZATIONS:   - Tdap: Tetanus vaccination status reviewed: last tetanus booster within 10 years. - Influenza: Refused  SCREENING: -Mammogram: Ordered today  - Colonoscopy: Up to date   PATIENT COUNSELING:   Advised to take 1 mg of folate supplement per day if capable of pregnancy.   Sexuality: Discussed sexually transmitted diseases, partner selection, use of condoms, avoidance of unintended pregnancy  and contraceptive alternatives.   Advised to avoid cigarette smoking.  I discussed with the patient that most people either abstain from alcohol or drink within James limits (<=14/week and <=4 drinks/occasion for males, <=7/weeks and <= 3 drinks/occasion for females) and that the risk for alcohol disorders and other health effects rises proportionally with the number of drinks per week and how often a drinker exceeds daily limits.  Discussed cessation/primary prevention of drug use and availability of treatment for abuse.   Diet: Encouraged to adjust caloric intake to maintain  or achieve  ideal body weight, to reduce intake of dietary saturated fat and total fat, to limit sodium intake by avoiding high sodium foods and not adding table salt, and to maintain adequate dietary potassium and calcium preferably from fresh fruits, vegetables,  and low-fat dairy products.    stressed the importance of regular exercise  Injury prevention: Discussed safety belts, safety helmets, smoke detector, smoking near bedding or upholstery.   Dental health: Discussed importance of regular tooth brushing, flossing, and dental visits.    NEXT PREVENTATIVE PHYSICAL DUE IN 1 YEAR. Return in about 4 weeks (around 12/30/2018) for Anxiety, BP f/u.

## 2018-12-02 NOTE — Assessment & Plan Note (Signed)
Did not have much benefit with rare prn xanax in the past, open to starting zoloft low dose daily. Will recheck in 1 month. Discussed breathing exercises to help in panic episodes

## 2018-12-02 NOTE — Assessment & Plan Note (Signed)
Reports doing better on lipitor so will switch over to lipitor. Continue working on diet and exercise, recheck lipids today

## 2018-12-02 NOTE — Assessment & Plan Note (Signed)
Increase flonase to BID, take antihistamine daily.

## 2018-12-03 LAB — CBC WITH DIFFERENTIAL/PLATELET
Basophils Absolute: 0.1 10*3/uL (ref 0.0–0.2)
Basos: 1 %
EOS (ABSOLUTE): 0.1 10*3/uL (ref 0.0–0.4)
Eos: 1 %
Hematocrit: 41 % (ref 34.0–46.6)
Hemoglobin: 13.6 g/dL (ref 11.1–15.9)
Immature Grans (Abs): 0 10*3/uL (ref 0.0–0.1)
Immature Granulocytes: 0 %
Lymphocytes Absolute: 2.5 10*3/uL (ref 0.7–3.1)
Lymphs: 37 %
MCH: 28.5 pg (ref 26.6–33.0)
MCHC: 33.2 g/dL (ref 31.5–35.7)
MCV: 86 fL (ref 79–97)
Monocytes Absolute: 0.6 10*3/uL (ref 0.1–0.9)
Monocytes: 8 %
Neutrophils Absolute: 3.7 10*3/uL (ref 1.4–7.0)
Neutrophils: 53 %
Platelets: 356 10*3/uL (ref 150–450)
RBC: 4.77 x10E6/uL (ref 3.77–5.28)
RDW: 13.1 % (ref 11.7–15.4)
WBC: 6.9 10*3/uL (ref 3.4–10.8)

## 2018-12-03 LAB — COMPREHENSIVE METABOLIC PANEL
ALT: 12 IU/L (ref 0–32)
AST: 18 IU/L (ref 0–40)
Albumin/Globulin Ratio: 1.8 (ref 1.2–2.2)
Albumin: 4.4 g/dL (ref 3.8–4.8)
Alkaline Phosphatase: 113 IU/L (ref 39–117)
BUN/Creatinine Ratio: 26 (ref 12–28)
BUN: 18 mg/dL (ref 8–27)
Bilirubin Total: 0.4 mg/dL (ref 0.0–1.2)
CO2: 23 mmol/L (ref 20–29)
Calcium: 9.3 mg/dL (ref 8.7–10.3)
Chloride: 100 mmol/L (ref 96–106)
Creatinine, Ser: 0.68 mg/dL (ref 0.57–1.00)
GFR calc Af Amer: 108 mL/min/{1.73_m2} (ref >59)
GFR calc non Af Amer: 93 mL/min/{1.73_m2} (ref >59)
Globulin, Total: 2.4 g/dL (ref 1.5–4.5)
Glucose: 59 mg/dL — ABNORMAL LOW (ref 65–99)
Potassium: 4.4 mmol/L (ref 3.5–5.2)
Sodium: 142 mmol/L (ref 134–144)
Total Protein: 6.8 g/dL (ref 6.0–8.5)

## 2018-12-03 LAB — LIPID PANEL W/O CHOL/HDL RATIO
Cholesterol, Total: 142 mg/dL (ref 100–199)
HDL: 47 mg/dL (ref >39)
LDL Chol Calc (NIH): 70 mg/dL (ref 0–99)
Triglycerides: 146 mg/dL (ref 0–149)
VLDL Cholesterol Cal: 25 mg/dL (ref 5–40)

## 2018-12-03 LAB — TSH: TSH: 1.85 u[IU]/mL (ref 0.450–4.500)

## 2018-12-04 LAB — UA/M W/RFLX CULTURE, ROUTINE
Bilirubin, UA: NEGATIVE
Glucose, UA: NEGATIVE
Ketones, UA: NEGATIVE
Nitrite, UA: NEGATIVE
Protein,UA: NEGATIVE
Specific Gravity, UA: 1.02 (ref 1.005–1.030)
Urobilinogen, Ur: 0.2 mg/dL (ref 0.2–1.0)
pH, UA: 7 (ref 5.0–7.5)

## 2018-12-04 LAB — URINE CULTURE, REFLEX

## 2018-12-04 LAB — MICROSCOPIC EXAMINATION: Bacteria, UA: NONE SEEN

## 2019-01-01 ENCOUNTER — Ambulatory Visit (INDEPENDENT_AMBULATORY_CARE_PROVIDER_SITE_OTHER): Payer: Managed Care, Other (non HMO) | Admitting: Family Medicine

## 2019-01-01 ENCOUNTER — Encounter: Payer: Self-pay | Admitting: Family Medicine

## 2019-01-01 ENCOUNTER — Other Ambulatory Visit: Payer: Self-pay

## 2019-01-01 VITALS — BP 137/80 | HR 91 | Temp 98.7°F

## 2019-01-01 DIAGNOSIS — R350 Frequency of micturition: Secondary | ICD-10-CM | POA: Diagnosis not present

## 2019-01-01 DIAGNOSIS — I10 Essential (primary) hypertension: Secondary | ICD-10-CM | POA: Diagnosis not present

## 2019-01-01 DIAGNOSIS — F419 Anxiety disorder, unspecified: Secondary | ICD-10-CM

## 2019-01-01 MED ORDER — SULFAMETHOXAZOLE-TRIMETHOPRIM 800-160 MG PO TABS: 1.0000 | ORAL_TABLET | Freq: Two times a day (BID) | ORAL | 0 refills | Status: DC

## 2019-01-01 MED ORDER — SERTRALINE HCL 25 MG PO TABS: 25.0000 mg | ORAL_TABLET | Freq: Every day | ORAL | 1 refills | Status: DC

## 2019-01-01 MED ORDER — PHENAZOPYRIDINE HCL 200 MG PO TABS: 200.0000 mg | ORAL_TABLET | Freq: Three times a day (TID) | ORAL | 0 refills | Status: DC | PRN

## 2019-01-01 NOTE — Assessment & Plan Note (Signed)
Significant benefit with zoloft, continue current regimen

## 2019-01-01 NOTE — Assessment & Plan Note (Signed)
Had some jitters with the amlodipine so stopped it, but also notes since anxiety under better control BPs have been WNL. Continue close home monitoring, DASH diet, stress control, exercise. Call with abnormal home readings

## 2019-01-01 NOTE — Progress Notes (Signed)
BP 137/80 (BP Location: Left Arm, Cuff Size: Normal)   Pulse 91   Temp 98.7 F (37.1 C) (Oral)   LMP  (LMP Unknown)   SpO2 97%    Subjective:    Patient ID: Beth James, female    DOB: March 06, 1955, 63 y.o.   MRN: [id number]  HPI: Beth James is a 63 y.o. female  Chief Complaint  Patient presents with  . Anxiety  . Hypertension  . Urinary Tract Infection    pt states she has had lower abdominal pain and burning since yesterday   Yesterday started with suprapubic abdominal pain, nausea and urinary frequency and dysuria (mild). Was taking AZO and tylenol with mild relief. Denies fever, chills, vomiting, stool changes, hematuria.   Since she's been taking the zoloft, her BPs have been 130s/80s. Tried the amlopipine but it made her jittery and notes she really hasn't needed it. The zoloft seems to be helping a lot. No daily nervousness, panic episodes, and just overall feels in a better headspace. Denies side effects other than possibly more frequent BMs but this is not bothersome for her.   Depression screen [facility] 2/9 01/01/2019 10/03/2017 12/03/2016  Decreased Interest 0 0 0  Down, Depressed, Hopeless 0 0 0  PHQ - 2 Score 0 0 0  Altered sleeping 0 0 1  Tired, decreased energy 0 1 0  Change in appetite 0 0 0  Feeling bad or failure about yourself  0 0 0  Trouble concentrating 0 0 0  Moving slowly or fidgety/restless 0 0 0  Suicidal thoughts 0 0 0  PHQ-9 Score 0 1 1  Difficult doing work/chores Not difficult at all - -   GAD 7 : Generalized Anxiety Score 01/01/2019 10/03/2017 01/03/2017 12/03/2016  Nervous, Anxious, on Edge 0 1 1 2   Control/stop worrying 0 0 0 2  Worry too much - different things 0 0 1 2  Trouble relaxing 0 0 0 2  Restless - 0 0 0  Easily annoyed or irritable 0 0 0 1  Afraid - awful might happen 0 1 1 2   Total GAD 7 Score - 2 3 11   Anxiety Difficulty Not difficult at all - Not difficult at all Very difficult   Relevant past medical, surgical, family and  social history reviewed and updated as indicated. Interim medical history since our last visit reviewed. Allergies and medications reviewed and updated.  Review of Systems  Per HPI unless specifically indicated above     Objective:    BP 137/80 (BP Location: Left Arm, Cuff Size: Normal)   Pulse 91   Temp 98.7 F (37.1 C) (Oral)   LMP  (LMP Unknown)   SpO2 97%   Wt Readings from Last 3 Encounters:  12/02/18 184 lb 3.2 oz (83.6 kg)  04/07/18 179 lb 12.8 oz (81.6 kg)  02/23/18 176 lb 12.8 oz (80.2 kg)    Physical Exam Vitals signs and nursing note reviewed.  Constitutional:      Appearance: Normal appearance. She is not ill-appearing.  HENT:     Head: Atraumatic.  Eyes:     Extraocular Movements: Extraocular movements intact.     Conjunctiva/sclera: Conjunctivae normal.  Neck:     Musculoskeletal: Normal range of motion and neck supple.  Cardiovascular:     Rate and Rhythm: Normal rate and regular rhythm.     Heart sounds: Normal heart sounds.  Pulmonary:     Effort: Pulmonary effort is normal.  Breath sounds: Normal breath sounds.  Abdominal:     General: Bowel sounds are normal. There is no distension.     Palpations: Abdomen is soft. There is no mass.     Tenderness: There is abdominal tenderness (suprapubic ttp). There is no right CVA tenderness, left CVA tenderness or rebound.  Musculoskeletal: Normal range of motion.  Skin:    General: Skin is warm and dry.  Neurological:     Mental Status: She is alert and oriented to person, place, and time.  Psychiatric:        Mood and Affect: Mood normal.        Thought Content: Thought content normal.        Judgment: Judgment normal.     Results for orders placed or performed in visit on 01/01/19  Microscopic Examination   URINE  Result Value Ref Range   WBC, UA 0-5 0 - 5 /hpf   RBC 3-10 (A) 0 - 2 /hpf   Epithelial Cells (non renal) 0-10 0 - 10 /hpf   Bacteria, UA None seen None seen/Few  Urine Culture,  Reflex   URINE  Result Value Ref Range   Urine Culture, Routine WILL FOLLOW   UA/M w/rflx Culture, Routine-H   Specimen: Urine   URINE  Result Value Ref Range   Specific Gravity, UA CANCELED    pH, UA CANCELED    Color, UA Orange Yellow   Appearance Ur Clear Clear   Protein,UA CANCELED    Glucose, UA CANCELED    Ketones, UA CANCELED    Microscopic Examination See below:    Urinalysis Reflex Comment       Assessment & Plan:   Problem List Items Addressed This Visit      Cardiovascular and Mediastinum   Essential hypertension - Primary    Had some jitters with the amlodipine so stopped it, but also notes since anxiety under better control BPs have been WNL. Continue close home monitoring, DASH diet, stress control, exercise. Call with abnormal home readings        Other   Anxiety    Significant benefit with zoloft, continue current regimen      Relevant Medications   sertraline (ZOLOFT) 25 MG tablet    Other Visit Diagnoses    Urinary frequency       U/A results skewed from AZO, will send for culture and treat empirically with bactrim and pyridium. F/u for repeat U/A if not improving   Relevant Orders   UA/M w/rflx Culture, Routine-H (Completed)       Follow up plan: Return in about 6 months (around 07/01/2019) for 6 month f/u.

## 2019-01-04 ENCOUNTER — Other Ambulatory Visit: Payer: Self-pay | Admitting: Family Medicine

## 2019-01-04 LAB — UA/M W/RFLX CULTURE, ROUTINE

## 2019-01-04 LAB — MICROSCOPIC EXAMINATION: Bacteria, UA: NONE SEEN

## 2019-01-04 LAB — URINE CULTURE, REFLEX

## 2019-01-04 MED ORDER — NITROFURANTOIN MONOHYD MACRO 100 MG PO CAPS: 100.0000 mg | ORAL_CAPSULE | Freq: Two times a day (BID) | ORAL | 0 refills | Status: DC

## 2019-06-20 ENCOUNTER — Other Ambulatory Visit: Payer: Self-pay | Admitting: Family Medicine

## 2019-06-20 NOTE — Telephone Encounter (Signed)
Requested Prescriptions  Pending Prescriptions Disp Refills  . sertraline (ZOLOFT) 25 MG tablet [Pharmacy Med Name: SERTRALINE 25MG  TABLETS] 90 tablet 0    Sig: TAKE 1 TABLET(25 MG) BY MOUTH DAILY     Psychiatry:  Antidepressants - SSRI Passed - 06/20/2019  1:02 PM      Passed - Valid encounter within last 6 months    Recent Outpatient Visits          5 months ago Essential hypertension   [facility] [place]   6 months ago Essential hypertension   [facility] [place], [place]   1 year ago Mixed hyperlipidemia   [facility] [place], [place]   1 year ago Viral upper respiratory tract infection   [facility] [place], [place], [place]   1 year ago Anxiety   [facility], [place]      Future Appointments            In 2 weeks [place], Maurice March, PA-C [facility], [facility]

## 2019-07-01 ENCOUNTER — Ambulatory Visit: Payer: Managed Care, Other (non HMO) | Admitting: Family Medicine

## 2019-07-05 ENCOUNTER — Ambulatory Visit (INDEPENDENT_AMBULATORY_CARE_PROVIDER_SITE_OTHER): Payer: Managed Care, Other (non HMO) | Admitting: Family Medicine

## 2019-07-05 ENCOUNTER — Encounter: Payer: Self-pay | Admitting: Family Medicine

## 2019-07-05 ENCOUNTER — Other Ambulatory Visit: Payer: Self-pay

## 2019-07-05 VITALS — BP 124/88 | HR 77 | Temp 98.2°F | Ht 58.39 in | Wt 181.4 lb

## 2019-07-05 DIAGNOSIS — J309 Allergic rhinitis, unspecified: Secondary | ICD-10-CM | POA: Diagnosis not present

## 2019-07-05 DIAGNOSIS — E782 Mixed hyperlipidemia: Secondary | ICD-10-CM

## 2019-07-05 DIAGNOSIS — K219 Gastro-esophageal reflux disease without esophagitis: Secondary | ICD-10-CM

## 2019-07-05 DIAGNOSIS — F419 Anxiety disorder, unspecified: Secondary | ICD-10-CM

## 2019-07-05 DIAGNOSIS — I10 Essential (primary) hypertension: Secondary | ICD-10-CM | POA: Diagnosis not present

## 2019-07-05 MED ORDER — SERTRALINE HCL 25 MG PO TABS: ORAL_TABLET | ORAL | 1 refills | Status: DC

## 2019-07-05 MED ORDER — MELOXICAM 15 MG PO TABS
15.0000 mg | ORAL_TABLET | Freq: Every day | ORAL | 0 refills | Status: DC
Start: 2019-07-05 — End: 2020-01-05

## 2019-07-05 MED ORDER — ROSUVASTATIN CALCIUM 10 MG PO TABS: 10.0000 mg | ORAL_TABLET | Freq: Every day | ORAL | 1 refills | Status: DC

## 2019-07-05 NOTE — Patient Instructions (Signed)
Diclofenac or voltaren gel

## 2019-07-05 NOTE — Progress Notes (Signed)
BP 124/88   Pulse 77   Temp 98.2 F (36.8 C)   Ht 4' 10.39" (1.483 m)   Wt 181 lb 6 oz (82.3 kg)   LMP  (LMP Unknown)   SpO2 98%   BMI 37.41 kg/m    Subjective:    Patient ID: Beth James, female    DOB: 1955-05-20, 64 y.o.   MRN: [id number]  HPI: Beth James is a 64 y.o. female  Chief Complaint  Patient presents with  . Hyperlipidemia  . Hypertension  . Anxiety   Presenting today for 6 month f/u.   Stopped taking the lipitor due to myalgias about 3 months ago. Was previously on crestor and tolerated that well. Denies CP, SOB, claudication, myalgias.   HTN - not checking BP at home. Not currently on medications for this. Denies CP, SOB, HAs, myalgias.   Aleve and tylenol taking the edge of her arthritis pains but overall still in quite a bit of pain. Mom has RA, OA, and gouty arthritis.   Anxiety well controlled with zoloft. Denies SI/HI.   Depression screen [facility] 2/9 07/05/2019 01/01/2019 10/03/2017  Decreased Interest 0 0 0  Down, Depressed, Hopeless 3 0 0  PHQ - 2 Score 3 0 0  Altered sleeping 0 0 0  Tired, decreased energy 3 0 1  Change in appetite 0 0 0  Feeling bad or failure about yourself  0 0 0  Trouble concentrating 0 0 0  Moving slowly or fidgety/restless 0 0 0  Suicidal thoughts 0 0 0  PHQ-9 Score 6 0 1  Difficult doing work/chores Not difficult at all Not difficult at all -   GAD 7 : Generalized Anxiety Score 07/05/2019 01/01/2019 10/03/2017 01/03/2017  Nervous, Anxious, on Edge 0 0 1 1  Control/stop worrying 0 0 0 0  Worry too much - different things 0 0 0 1  Trouble relaxing 0 0 0 0  Restless 0 - 0 0  Easily annoyed or irritable 0 0 0 0  Afraid - awful might happen 0 0 1 1  Total GAD 7 Score 0 - 2 3  Anxiety Difficulty Not difficult at all Not difficult at all - Not difficult at all     Relevant past medical, surgical, family and social history reviewed and updated as indicated. Interim medical history since our last visit  reviewed. Allergies and medications reviewed and updated.  Review of Systems  Per HPI unless specifically indicated above     Objective:    BP 124/88   Pulse 77   Temp 98.2 F (36.8 C)   Ht 4' 10.39" (1.483 m)   Wt 181 lb 6 oz (82.3 kg)   LMP  (LMP Unknown)   SpO2 98%   BMI 37.41 kg/m   Wt Readings from Last 3 Encounters:  07/05/19 181 lb 6 oz (82.3 kg)  12/02/18 184 lb 3.2 oz (83.6 kg)  04/07/18 179 lb 12.8 oz (81.6 kg)    Physical Exam Vitals and nursing note reviewed.  Constitutional:      Appearance: Normal appearance. She is not ill-appearing.  HENT:     Head: Atraumatic.  Eyes:     Extraocular Movements: Extraocular movements intact.     Conjunctiva/sclera: Conjunctivae normal.  Cardiovascular:     Rate and Rhythm: Normal rate and regular rhythm.     Heart sounds: Normal heart sounds.  Pulmonary:     Effort: Pulmonary effort is normal.     Breath sounds: Normal  breath sounds.  Musculoskeletal:        General: Normal range of motion.     Cervical back: Normal range of motion and neck supple.  Skin:    General: Skin is warm and dry.  Neurological:     Mental Status: She is alert and oriented to person, place, and time.  Psychiatric:        Mood and Affect: Mood normal.        Thought Content: Thought content normal.        Judgment: Judgment normal.     Results for orders placed or performed in visit on 07/05/19  Comprehensive metabolic panel  Result Value Ref Range   Glucose 91 65 - 99 mg/dL   BUN 14 8 - 27 mg/dL   Creatinine, Ser 8.41 0.57 - 1.00 mg/dL   GFR calc non Af Amer 97 >59 mL/min/1.73   GFR calc Af Amer 112 >59 mL/min/1.73   BUN/Creatinine Ratio 23 12 - 28   Sodium 144 134 - 144 mmol/L   Potassium 4.5 3.5 - 5.2 mmol/L   Chloride 106 96 - 106 mmol/L   CO2 25 20 - 29 mmol/L   Calcium 9.3 8.7 - 10.3 mg/dL   Total Protein 6.6 6.0 - 8.5 g/dL   Albumin 4.3 3.8 - 4.8 g/dL   Globulin, Total 2.3 1.5 - 4.5 g/dL   Albumin/Globulin Ratio 1.9  1.2 - 2.2   Bilirubin Total 0.4 0.0 - 1.2 mg/dL   Alkaline Phosphatase 105 39 - 117 IU/L   AST 13 0 - 40 IU/L   ALT 9 0 - 32 IU/L  Lipid Panel w/o Chol/HDL Ratio  Result Value Ref Range   Cholesterol, Total 211 (H) 100 - 199 mg/dL   Triglycerides 660 (H) 0 - 149 mg/dL   HDL 42 >63 mg/dL   VLDL Cholesterol Cal 35 5 - 40 mg/dL   LDL Chol Calc (NIH) 016 (H) 0 - 99 mg/dL      Assessment & Plan:   Problem List Items Addressed This Visit      Cardiovascular and Mediastinum   Essential hypertension - Primary    BPs stable and under good control, continue to monitor      Relevant Medications   rosuvastatin (CRESTOR) 10 MG tablet   Other Relevant Orders   Comprehensive metabolic panel (Completed)     Respiratory   Rhinitis, allergic    Stable and well controlled, continue current regimen        Digestive   Gastroesophageal reflux disease without esophagitis    Stable and under good control, continue current regimen        Other   Anxiety    Stable, well controlled. Continue current regimen      Relevant Medications   sertraline (ZOLOFT) 25 MG tablet   Hyperlipidemia    Continue d/c of lipitor due to myalgias. Restart crestor, continue to monitor. Lifestyle modifications reviewed      Relevant Medications   rosuvastatin (CRESTOR) 10 MG tablet   Other Relevant Orders   Lipid Panel w/o Chol/HDL Ratio (Completed)       Follow up plan: Return in about 6 months (around 01/05/2020) for CPE.

## 2019-07-06 LAB — LIPID PANEL W/O CHOL/HDL RATIO
Cholesterol, Total: 211 mg/dL — ABNORMAL HIGH (ref 100–199)
HDL: 42 mg/dL (ref >39)
LDL Chol Calc (NIH): 134 mg/dL — ABNORMAL HIGH (ref 0–99)
Triglycerides: 194 mg/dL — ABNORMAL HIGH (ref 0–149)
VLDL Cholesterol Cal: 35 mg/dL (ref 5–40)

## 2019-07-06 LAB — COMPREHENSIVE METABOLIC PANEL
ALT: 9 IU/L (ref 0–32)
AST: 13 IU/L (ref 0–40)
Albumin/Globulin Ratio: 1.9 (ref 1.2–2.2)
Albumin: 4.3 g/dL (ref 3.8–4.8)
Alkaline Phosphatase: 105 IU/L (ref 39–117)
BUN/Creatinine Ratio: 23 (ref 12–28)
BUN: 14 mg/dL (ref 8–27)
Bilirubin Total: 0.4 mg/dL (ref 0.0–1.2)
CO2: 25 mmol/L (ref 20–29)
Calcium: 9.3 mg/dL (ref 8.7–10.3)
Chloride: 106 mmol/L (ref 96–106)
Creatinine, Ser: 0.61 mg/dL (ref 0.57–1.00)
GFR calc Af Amer: 112 mL/min/{1.73_m2} (ref >59)
GFR calc non Af Amer: 97 mL/min/{1.73_m2} (ref >59)
Globulin, Total: 2.3 g/dL (ref 1.5–4.5)
Glucose: 91 mg/dL (ref 65–99)
Potassium: 4.5 mmol/L (ref 3.5–5.2)
Sodium: 144 mmol/L (ref 134–144)
Total Protein: 6.6 g/dL (ref 6.0–8.5)

## 2019-07-11 NOTE — Assessment & Plan Note (Signed)
Continue d/c of lipitor due to myalgias. Restart crestor, continue to monitor. Lifestyle modifications reviewed

## 2019-07-11 NOTE — Assessment & Plan Note (Signed)
Stable and under good control, continue current regimen 

## 2019-07-11 NOTE — Assessment & Plan Note (Signed)
Stable and well controlled, continue current regimen 

## 2019-07-11 NOTE — Assessment & Plan Note (Signed)
BPs stable and under good control, continue to monitor

## 2019-07-11 NOTE — Assessment & Plan Note (Signed)
Stable, well controlled. Continue current regimen 

## 2019-07-30 ENCOUNTER — Encounter: Payer: Self-pay | Admitting: Family Medicine

## 2019-11-15 ENCOUNTER — Telehealth: Payer: Self-pay | Admitting: Family Medicine

## 2019-11-15 DIAGNOSIS — Z1152 Encounter for screening for COVID-19: Secondary | ICD-10-CM

## 2019-11-15 NOTE — Telephone Encounter (Signed)
Previous RL patient. Routing to provider for order.

## 2019-11-15 NOTE — Telephone Encounter (Signed)
Copied from CRM [id number]. Topic: General - Inquiry >> Nov 15, 2019  2:40 PM Beth James wrote: Reason for CRM: Patient would like the antibodies test for covid. She can be reached at [phone]. Please advise

## 2019-11-16 NOTE — Telephone Encounter (Signed)
Called pt scheduled lab for tomorrow

## 2019-11-16 NOTE — Telephone Encounter (Signed)
Order in.

## 2019-11-17 ENCOUNTER — Other Ambulatory Visit: Payer: Self-pay

## 2019-11-17 ENCOUNTER — Other Ambulatory Visit: Payer: Managed Care, Other (non HMO)

## 2019-11-17 DIAGNOSIS — Z1152 Encounter for screening for COVID-19: Secondary | ICD-10-CM

## 2019-11-18 LAB — SARS-COV-2 SEMI-QUANTITATIVE TOTAL ANTIBODY, SPIKE
SARS-CoV-2 Semi-Quant Total Ab: <0.4 U/mL (ref <0.8)
SARS-CoV-2 Spike Ab Interp: NEGATIVE

## 2020-01-05 ENCOUNTER — Ambulatory Visit: Payer: Managed Care, Other (non HMO) | Admitting: Family Medicine

## 2020-01-05 ENCOUNTER — Other Ambulatory Visit: Payer: Self-pay

## 2020-01-05 ENCOUNTER — Encounter: Payer: Self-pay | Admitting: Family Medicine

## 2020-01-05 ENCOUNTER — Telehealth (INDEPENDENT_AMBULATORY_CARE_PROVIDER_SITE_OTHER): Payer: Managed Care, Other (non HMO) | Admitting: Family Medicine

## 2020-01-05 DIAGNOSIS — Z20822 Contact with and (suspected) exposure to covid-19: Secondary | ICD-10-CM | POA: Diagnosis not present

## 2020-01-05 DIAGNOSIS — J0100 Acute maxillary sinusitis, unspecified: Secondary | ICD-10-CM | POA: Diagnosis not present

## 2020-01-05 DIAGNOSIS — F419 Anxiety disorder, unspecified: Secondary | ICD-10-CM

## 2020-01-05 DIAGNOSIS — E782 Mixed hyperlipidemia: Secondary | ICD-10-CM | POA: Diagnosis not present

## 2020-01-05 DIAGNOSIS — I10 Essential (primary) hypertension: Secondary | ICD-10-CM

## 2020-01-05 MED ORDER — HYDROCOD POLST-CPM POLST ER 10-8 MG/5ML PO SUER: 5.0000 mL | Freq: Two times a day (BID) | ORAL | 0 refills | Status: DC | PRN

## 2020-01-05 MED ORDER — SERTRALINE HCL 25 MG PO TABS: ORAL_TABLET | ORAL | 1 refills | Status: DC

## 2020-01-05 MED ORDER — DOXYCYCLINE HYCLATE 100 MG PO TABS: 100.0000 mg | ORAL_TABLET | Freq: Two times a day (BID) | ORAL | 0 refills | Status: DC

## 2020-01-05 MED ORDER — ROSUVASTATIN CALCIUM 10 MG PO TABS: 10.0000 mg | ORAL_TABLET | Freq: Every day | ORAL | 1 refills | Status: DC

## 2020-01-05 NOTE — Progress Notes (Signed)
LMP  (LMP Unknown)    Subjective:    Patient ID: Beth James, female    DOB: 02/20/1956, 64 y.o.   MRN: [id number]  HPI: Beth James is a 64 y.o. female  Chief Complaint  Patient presents with  . Cough    Started last week, has been taking OTC cold remedies  . Sore Throat  . Nasal Congestion  . nasal drainage   UPPER RESPIRATORY TRACT INFECTION Duration: 1 week Worst symptom: drainage and tickle in her throat, cough Fever: no Cough: yes Shortness of breath: no Wheezing: no Chest pain: no Chest tightness: no Chest congestion: no Nasal congestion: yes Runny nose: yes Post nasal drip: yes Sneezing: yes Sore throat: yes Swollen glands: no Sinus pressure: yes Headache: yes Face pain: yes Toothache: no Ear pain: no  Ear pressure: yes "right Eyes red/itching:no Eye drainage/crusting: yes  Vomiting: no Rash: no Fatigue: yes Sick contacts: no Strep contacts: no  Context: worse Recurrent sinusitis: no Relief with OTC cold/cough medications: no  Treatments attempted: anti-histamine and cough syrup   HYPERTENSION / HYPERLIPIDEMIA Satisfied with current treatment? yes Duration of hypertension: chronic BP monitoring frequency: not checking BP medication side effects: not on anything Past BP meds: not on anything Duration of hyperlipidemia: chronic Cholesterol medication side effects: no Cholesterol supplements: none Past cholesterol medications: crestor Medication compliance: excellent compliance Aspirin: yes Recent stressors: no Recurrent headaches: no Visual changes: no Palpitations: no Dyspnea: no Chest pain: no Lower extremity edema: no Dizzy/lightheaded: no  ANXIETY/STRESS Duration:stable Anxious mood: no  Excessive worrying: no Irritability: no  Sweating: no Nausea: no Palpitations:no Hyperventilation: no Panic attacks: no Agoraphobia: no  Obscessions/compulsions: no Depressed mood: no Depression screen [facility] 2/9 07/05/2019  01/01/2019 10/03/2017 12/03/2016 09/03/2016  Decreased Interest 0 0 0 0 0  Down, Depressed, Hopeless 3 0 0 0 0  PHQ - 2 Score 3 0 0 0 0  Altered sleeping 0 0 0 1 -  Tired, decreased energy 3 0 1 0 -  Change in appetite 0 0 0 0 -  Feeling bad or failure about yourself  0 0 0 0 -  Trouble concentrating 0 0 0 0 -  Moving slowly or fidgety/restless 0 0 0 0 -  Suicidal thoughts 0 0 0 0 -  PHQ-9 Score 6 0 1 1 -  Difficult doing work/chores Not difficult at all Not difficult at all - - -   Anhedonia: no Weight changes: no Insomnia: no   Hypersomnia: no Fatigue/loss of energy: no Feelings of worthlessness: no Feelings of guilt: no Impaired concentration/indecisiveness: no Suicidal ideations: no  Crying spells: no Recent Stressors/Life Changes: no   Relationship problems: no   Family stress: no     Financial stress: no    Job stress: no    Recent death/loss: no   Relevant past medical, surgical, family and social history reviewed and updated as indicated. Interim medical history since our last visit reviewed. Allergies and medications reviewed and updated.  Review of Systems  Constitutional: Positive for fatigue. Negative for activity change, appetite change, chills, diaphoresis, fever and unexpected weight change.  HENT: Positive for congestion, postnasal drip, rhinorrhea, sinus pressure, sinus pain and sore throat. Negative for dental problem, drooling, ear discharge, ear pain, facial swelling, hearing loss, mouth sores, nosebleeds, sneezing, tinnitus, trouble swallowing and voice change.   Respiratory: Positive for cough, shortness of breath and wheezing. Negative for apnea, choking, chest tightness and stridor.   Cardiovascular: Negative.   Gastrointestinal: Negative.  Musculoskeletal: Negative.   Psychiatric/Behavioral: Negative.     Per HPI unless specifically indicated above     Objective:    LMP  (LMP Unknown)   Wt Readings from Last 3 Encounters:  07/05/19 181 lb 6 oz  (82.3 kg)  12/02/18 184 lb 3.2 oz (83.6 kg)  04/07/18 179 lb 12.8 oz (81.6 kg)    Physical Exam Vitals and nursing note reviewed.  Constitutional:      General: She is not in acute distress.    Appearance: Normal appearance. She is not ill-appearing, toxic-appearing or diaphoretic.  HENT:     Head: Normocephalic and atraumatic.     Right Ear: External ear normal.     Left Ear: External ear normal.     Nose: Nose normal.     Mouth/Throat:     Mouth: Mucous membranes are moist.     Pharynx: Oropharynx is clear.  Eyes:     General: No scleral icterus.       Right eye: No discharge.        Left eye: No discharge.     Conjunctiva/sclera: Conjunctivae normal.     Pupils: Pupils are equal, round, and reactive to light.  Pulmonary:     Effort: Pulmonary effort is normal. No respiratory distress.     Comments: Speaking in full sentences Musculoskeletal:        General: Normal range of motion.     Cervical back: Normal range of motion.  Skin:    Coloration: Skin is not jaundiced or pale.     Findings: No bruising, erythema, lesion or rash.  Neurological:     Mental Status: She is alert and oriented to person, place, and time. Mental status is at baseline.  Psychiatric:        Mood and Affect: Mood normal.        Behavior: Behavior normal.        Thought Content: Thought content normal.        Judgment: Judgment normal.     Results for orders placed or performed in visit on 11/17/19  SARS-CoV-2 Semi-Quantitative Total Antibody, Spike  Result Value Ref Range   SARS-CoV-2 Semi-Quant Total Ab <0.4 Negative<0.8 U/mL   SARS-CoV-2 Spike Ab Interp Negative       Assessment & Plan:   Problem List Items Addressed This Visit      Cardiovascular and Mediastinum   Essential hypertension    Cannot get BP today. Will get her back in for recheck when she is feeling better.       Relevant Medications   rosuvastatin (CRESTOR) 10 MG tablet     Other   Anxiety    Under good control  on current regimen. Continue current regimen. Continue to monitor. Call with any concerns. Refills given.        Relevant Medications   sertraline (ZOLOFT) 25 MG tablet   Hyperlipidemia    Under good control on current regimen. Continue current regimen. Continue to monitor. Call with any concerns. Refills given. Labs to be checked when she's feeling better.        Relevant Medications   rosuvastatin (CRESTOR) 10 MG tablet   Other Relevant Orders   Comprehensive metabolic panel   Lipid Panel w/o Chol/HDL Ratio    Other Visit Diagnoses    Suspected COVID-19 virus infection    -  Primary   Self-quarantine until results are back. Will swab. Call with any concerns. Continue to monitor.    Relevant Orders  Novel Coronavirus, NAA ([facility])   Acute non-recurrent maxillary sinusitis       Will treat with doxycycline. Tussionex for comfort. Call with any concerns. Continue to monitor.    Relevant Medications   doxycycline (VIBRA-TABS) 100 MG tablet   chlorpheniramine-HYDROcodone (TUSSIONEX PENNKINETIC ER) 10-8 MG/5ML SUER       Follow up plan: Return in about 6 months (around 07/04/2020) for physical.    . This visit was completed via MyChart due to the restrictions of the COVID-19 pandemic. All issues as above were discussed and addressed. Physical exam was done as above through visual confirmation on MyChart. If it was felt that the patient should be evaluated in the office, they were directed there. The patient verbally consented to this visit. . Location of the patient: parking lot . Location of the provider: work . Those involved with this call:  . Provider: Olevia Perches, DO . CMA: Tristan Schroeder, CMA . Front Desk/Registration: Harriet Pho  . Time spent on call: 25 minutes with patient face to face via video conference. More than 50% of this time was spent in counseling and coordination of care. 40 minutes total spent in review of patient's record and preparation of their  chart.

## 2020-01-05 NOTE — Assessment & Plan Note (Signed)
Under good control on current regimen. Continue current regimen. Continue to monitor. Call with any concerns. Refills given.   

## 2020-01-05 NOTE — Assessment & Plan Note (Addendum)
Under good control on current regimen. Continue current regimen. Continue to monitor. Call with any concerns. Refills given. Labs to be checked when she's feeling better.

## 2020-01-05 NOTE — Assessment & Plan Note (Signed)
Cannot get BP today. Will get her back in for recheck when she is feeling better.

## 2020-01-10 LAB — NOVEL CORONAVIRUS, NAA: SARS-CoV-2, NAA: NOT DETECTED

## 2020-01-31 ENCOUNTER — Ambulatory Visit (INDEPENDENT_AMBULATORY_CARE_PROVIDER_SITE_OTHER): Payer: Managed Care, Other (non HMO) | Admitting: Family Medicine

## 2020-01-31 ENCOUNTER — Encounter: Payer: Self-pay | Admitting: Family Medicine

## 2020-01-31 ENCOUNTER — Other Ambulatory Visit: Payer: Self-pay

## 2020-01-31 VITALS — BP 145/82 | HR 102 | Temp 98.1°F | Ht 58.78 in | Wt 187.0 lb

## 2020-01-31 DIAGNOSIS — E782 Mixed hyperlipidemia: Secondary | ICD-10-CM | POA: Diagnosis not present

## 2020-01-31 DIAGNOSIS — I10 Essential (primary) hypertension: Secondary | ICD-10-CM | POA: Diagnosis not present

## 2020-01-31 DIAGNOSIS — M1990 Unspecified osteoarthritis, unspecified site: Secondary | ICD-10-CM | POA: Diagnosis not present

## 2020-01-31 DIAGNOSIS — J0100 Acute maxillary sinusitis, unspecified: Secondary | ICD-10-CM

## 2020-01-31 DIAGNOSIS — F419 Anxiety disorder, unspecified: Secondary | ICD-10-CM

## 2020-01-31 MED ORDER — AMOXICILLIN-POT CLAVULANATE 875-125 MG PO TABS: 1.0000 | ORAL_TABLET | Freq: Two times a day (BID) | ORAL | 0 refills | Status: DC

## 2020-01-31 MED ORDER — DICLOFENAC SODIUM 75 MG PO TBEC: 75.0000 mg | DELAYED_RELEASE_TABLET | Freq: Two times a day (BID) | ORAL | 1 refills | Status: DC

## 2020-01-31 NOTE — Progress Notes (Signed)
BP (!) 145/82   Pulse (!) 102   Temp 98.1 F (36.7 C) (Oral)   Ht 4' 10.78" (1.493 m)   Wt 187 lb (84.8 kg)   LMP  (LMP Unknown)   SpO2 98%   BMI 38.05 kg/m    Subjective:    Patient ID: Beth James, female    DOB: January 31, 1956, 64 y.o.   MRN: [id number]  HPI: Beth James is a 64 y.o. female  Chief Complaint  Patient presents with  . Hyperlipidemia  . Hypertension  . Stuffiness and drainage    still remaining since Sinus infection 2 weeks ago   HYPERTENSION / HYPERLIPIDEMIA Satisfied with current treatment? yes Duration of hypertension: chronic BP monitoring frequency: rarely BP range: much better since starting the zoloft BP medication side effects: not on anything Duration of hyperlipidemia: chronic Cholesterol medication side effects: no Cholesterol supplements: none Past cholesterol medications: crestor Medication compliance: excellent compliance Aspirin: no Recent stressors: yes Recurrent headaches: no Visual changes: no Palpitations: no Dyspnea: no Chest pain: no Lower extremity edema: no Dizzy/lightheaded: no   ANXIETY/STRESS Duration: chronic Status:better Anxious mood: yes  Excessive worrying: no Irritability: no  Sweating: no Nausea: no Palpitations:no Hyperventilation: no Panic attacks: no Agoraphobia: no  Obscessions/compulsions: no Depressed mood: no Depression screen [facility] 2/9 01/31/2020 07/05/2019 01/01/2019 10/03/2017 12/03/2016  Decreased Interest 0 0 0 0 0  Down, Depressed, Hopeless 0 3 0 0 0  PHQ - 2 Score 0 3 0 0 0  Altered sleeping - 0 0 0 1  Tired, decreased energy - 3 0 1 0  Change in appetite - 0 0 0 0  Feeling bad or failure about yourself  - 0 0 0 0  Trouble concentrating - 0 0 0 0  Moving slowly or fidgety/restless - 0 0 0 0  Suicidal thoughts - 0 0 0 0  PHQ-9 Score - 6 0 1 1  Difficult doing work/chores - Not difficult at all Not difficult at all - -   Anhedonia: no Weight changes: no Insomnia: no     Hypersomnia: no Fatigue/loss of energy: no Feelings of worthlessness: no Feelings of guilt: no Impaired concentration/indecisiveness: no Suicidal ideations: no  Crying spells: no Recent Stressors/Life Changes: yes   Relationship problems: no   Family stress: no     Financial stress: no    Job stress: no    Recent death/loss: no  Still feeling stuffy in her nose and having some drainage. Doing much better following the antibiotics, but still not quite right. No fevers. No chills. No pain in her face.   Relevant past medical, surgical, family and social history reviewed and updated as indicated. Interim medical history since our last visit reviewed. Allergies and medications reviewed and updated.  Review of Systems  Constitutional: Positive for fatigue.  HENT: Positive for congestion, postnasal drip, rhinorrhea and sinus pressure. Negative for dental problem, drooling, ear discharge, ear pain, facial swelling, hearing loss, mouth sores, nosebleeds, sinus pain, sneezing, sore throat, tinnitus, trouble swallowing and voice change.   Respiratory: Negative.   Cardiovascular: Negative.   Gastrointestinal: Negative.   Musculoskeletal: Positive for arthralgias. Negative for back pain, gait problem, joint swelling, myalgias, neck pain and neck stiffness.  Neurological: Negative.   Psychiatric/Behavioral: Negative.     Per HPI unless specifically indicated above     Objective:    BP (!) 145/82   Pulse (!) 102   Temp 98.1 F (36.7 C) (Oral)   Ht 4' 10.78" (1.493  m)   Wt 187 lb (84.8 kg)   LMP  (LMP Unknown)   SpO2 98%   BMI 38.05 kg/m   Wt Readings from Last 3 Encounters:  01/31/20 187 lb (84.8 kg)  07/05/19 181 lb 6 oz (82.3 kg)  12/02/18 184 lb 3.2 oz (83.6 kg)    Physical Exam Vitals and nursing note reviewed.  Constitutional:      General: She is not in acute distress.    Appearance: Normal appearance. She is not ill-appearing, toxic-appearing or diaphoretic.  HENT:      Head: Normocephalic and atraumatic.     Right Ear: External ear normal.     Left Ear: External ear normal.     Nose: Nose normal.     Mouth/Throat:     Mouth: Mucous membranes are moist.     Pharynx: Oropharynx is clear.  Eyes:     General: No scleral icterus.       Right eye: No discharge.        Left eye: No discharge.     Extraocular Movements: Extraocular movements intact.     Conjunctiva/sclera: Conjunctivae normal.     Pupils: Pupils are equal, round, and reactive to light.  Cardiovascular:     Rate and Rhythm: Normal rate and regular rhythm.     Pulses: Normal pulses.     Heart sounds: Normal heart sounds. No murmur heard.  No friction rub. No gallop.   Pulmonary:     Effort: Pulmonary effort is normal. No respiratory distress.     Breath sounds: Normal breath sounds. No stridor. No wheezing, rhonchi or rales.  Chest:     Chest wall: No tenderness.  Musculoskeletal:        General: Normal range of motion.     Cervical back: Normal range of motion and neck supple.  Skin:    General: Skin is warm and dry.     Capillary Refill: Capillary refill takes less than 2 seconds.     Coloration: Skin is not jaundiced or pale.     Findings: No bruising, erythema, lesion or rash.  Neurological:     General: No focal deficit present.     Mental Status: She is alert and oriented to person, place, and time. Mental status is at baseline.  Psychiatric:        Mood and Affect: Mood normal.        Behavior: Behavior normal.        Thought Content: Thought content normal.        Judgment: Judgment normal.     Results for orders placed or performed in visit on 01/31/20  Comprehensive metabolic panel  Result Value Ref Range   Glucose 149 (H) 65 - 99 mg/dL   BUN 15 8 - 27 mg/dL   Creatinine, Ser 2.53 0.57 - 1.00 mg/dL   GFR calc non Af Amer 93 >59 mL/min/1.73   GFR calc Af Amer 107 >59 mL/min/1.73   BUN/Creatinine Ratio 22 12 - 28   Sodium 143 134 - 144 mmol/L   Potassium 4.3  3.5 - 5.2 mmol/L   Chloride 103 96 - 106 mmol/L   CO2 25 20 - 29 mmol/L   Calcium 9.4 8.7 - 10.3 mg/dL   Total Protein 6.8 6.0 - 8.5 g/dL   Albumin 4.3 3.8 - 4.8 g/dL   Globulin, Total 2.5 1.5 - 4.5 g/dL   Albumin/Globulin Ratio 1.7 1.2 - 2.2   Bilirubin Total 0.3 0.0 - 1.2 mg/dL  Alkaline Phosphatase 105 44 - 121 IU/L   AST 16 0 - 40 IU/L   ALT 17 0 - 32 IU/L  Lipid Panel w/o Chol/HDL Ratio  Result Value Ref Range   Cholesterol, Total 148 100 - 199 mg/dL   Triglycerides 962 (H) 0 - 149 mg/dL   HDL 48 >22 mg/dL   VLDL Cholesterol Cal 33 5 - 40 mg/dL   LDL Chol Calc (NIH) 67 0 - 99 mg/dL      Assessment & Plan:   Problem List Items Addressed This Visit      Cardiovascular and Mediastinum   Essential hypertension    BP still running a little high, still a little anxious. Will work on [employer] and continue to monitor. Will start monitoring at home and will call if running in the 140s systolic at home.      Relevant Orders   Comprehensive metabolic panel (Completed)     Other   Anxiety    Under good control on current regimen. Continue current regimen. Continue to monitor. Call with any concerns. Refills given. Labs drawn today.        Hyperlipidemia    Under good control on current regimen. Continue current regimen. Continue to monitor. Call with any concerns. Refills given. Labs drawn today.       Relevant Orders   Comprehensive metabolic panel (Completed)   Lipid Panel w/o Chol/HDL Ratio (Completed)    Other Visit Diagnoses    Acute non-recurrent maxillary sinusitis    -  Primary   Will treat with augmentin. Call if not getting better or getting worse.    Relevant Medications   amoxicillin-clavulanate (AUGMENTIN) 875-125 MG tablet   Arthritis       Will treat with short course of voltaren. Call if not getting better or getting worse.    Relevant Medications   diclofenac (VOLTAREN) 75 MG EC tablet       Follow up plan: Return 1-2 months, for follow up  arthitis.

## 2020-02-01 LAB — COMPREHENSIVE METABOLIC PANEL
ALT: 17 IU/L (ref 0–32)
AST: 16 IU/L (ref 0–40)
Albumin/Globulin Ratio: 1.7 (ref 1.2–2.2)
Albumin: 4.3 g/dL (ref 3.8–4.8)
Alkaline Phosphatase: 105 IU/L (ref 44–121)
BUN/Creatinine Ratio: 22 (ref 12–28)
BUN: 15 mg/dL (ref 8–27)
Bilirubin Total: 0.3 mg/dL (ref 0.0–1.2)
CO2: 25 mmol/L (ref 20–29)
Calcium: 9.4 mg/dL (ref 8.7–10.3)
Chloride: 103 mmol/L (ref 96–106)
Creatinine, Ser: 0.68 mg/dL (ref 0.57–1.00)
GFR calc Af Amer: 107 mL/min/{1.73_m2} (ref >59)
GFR calc non Af Amer: 93 mL/min/{1.73_m2} (ref >59)
Globulin, Total: 2.5 g/dL (ref 1.5–4.5)
Glucose: 149 mg/dL — ABNORMAL HIGH (ref 65–99)
Potassium: 4.3 mmol/L (ref 3.5–5.2)
Sodium: 143 mmol/L (ref 134–144)
Total Protein: 6.8 g/dL (ref 6.0–8.5)

## 2020-02-01 LAB — LIPID PANEL W/O CHOL/HDL RATIO
Cholesterol, Total: 148 mg/dL (ref 100–199)
HDL: 48 mg/dL (ref >39)
LDL Chol Calc (NIH): 67 mg/dL (ref 0–99)
Triglycerides: 197 mg/dL — ABNORMAL HIGH (ref 0–149)
VLDL Cholesterol Cal: 33 mg/dL (ref 5–40)

## 2020-02-01 NOTE — Assessment & Plan Note (Signed)
BP still running a little high, still a little anxious. Will work on [employer] and continue to monitor. Will start monitoring at home and will call if running in the 140s systolic at home.

## 2020-02-01 NOTE — Assessment & Plan Note (Signed)
Under good control on current regimen. Continue current regimen. Continue to monitor. Call with any concerns. Refills given. Labs drawn today.   

## 2020-03-03 ENCOUNTER — Encounter: Payer: Self-pay | Admitting: Family Medicine

## 2020-03-05 MED ORDER — EZETIMIBE 10 MG PO TABS: 10.0000 mg | ORAL_TABLET | Freq: Every day | ORAL | 3 refills | Status: DC

## 2020-03-14 ENCOUNTER — Encounter: Payer: Self-pay | Admitting: Family Medicine

## 2020-03-14 ENCOUNTER — Telehealth (INDEPENDENT_AMBULATORY_CARE_PROVIDER_SITE_OTHER): Payer: Managed Care, Other (non HMO) | Admitting: Family Medicine

## 2020-03-14 VITALS — BP 159/88 | Temp 97.7°F

## 2020-03-14 DIAGNOSIS — Z20822 Contact with and (suspected) exposure to covid-19: Secondary | ICD-10-CM

## 2020-03-14 MED ORDER — HYDROCOD POLST-CPM POLST ER 10-8 MG/5ML PO SUER
5.0000 mL | Freq: Every evening | ORAL | 0 refills | Status: DC | PRN
Start: 2020-03-14 — End: 2020-07-14

## 2020-03-14 MED ORDER — LIDOCAINE VISCOUS HCL 2 % MT SOLN: 15.0000 mL | OROMUCOSAL | 0 refills | Status: DC | PRN

## 2020-03-14 MED ORDER — BENZONATATE 200 MG PO CAPS: 200.0000 mg | ORAL_CAPSULE | Freq: Two times a day (BID) | ORAL | 0 refills | Status: DC | PRN

## 2020-03-14 MED ORDER — PREDNISONE 50 MG PO TABS: 50.0000 mg | ORAL_TABLET | Freq: Every day | ORAL | 0 refills | Status: DC

## 2020-03-14 NOTE — Progress Notes (Signed)
BP (!) 159/88   Temp 97.7 F (36.5 C) (Temporal)   LMP  (LMP Unknown)    Subjective:    Patient ID: Beth James, female    DOB: Apr 20, 1955, 65 y.o.   MRN: [id number]  HPI: Beth James is a 65 y.o. female  Chief Complaint  Patient presents with  . Covid Exposure    Patient began to feel sick on Saturday 1/15, had sore throat, chills, cough, headache, and pressure in her ears. Husband tested positive last Thursday 1/13.    UPPER RESPIRATORY TRACT INFECTION Duration: 3 days Worst symptom: sore throat Fever: no Cough: yes Shortness of breath: no Wheezing: no Chest pain: no Chest tightness: yes Chest congestion: no Nasal congestion: yes Runny nose: yes Post nasal drip: yes Sneezing: yes Sore throat: yes Swollen glands: no Sinus pressure: no Headache: yes Face pain: no Toothache: no Ear pain: yes, bilateral  Ear pressure: yes bilateral Eyes red/itching:yes Eye drainage/crusting: yes  Vomiting: no Rash: no Fatigue: yes Sick contacts: yes Strep contacts: no  Context: worse Recurrent sinusitis: no Relief with OTC cold/cough medications: no  Treatments attempted: albuterol, alka-seltzer   Relevant past medical, surgical, family and social history reviewed and updated as indicated. Interim medical history since our last visit reviewed. Allergies and medications reviewed and updated.  Review of Systems  Constitutional: Positive for fatigue. Negative for activity change, appetite change, chills, diaphoresis, fever and unexpected weight change.  HENT: Positive for congestion, postnasal drip, rhinorrhea, sore throat and voice change. Negative for dental problem, drooling, ear discharge, ear pain, facial swelling, hearing loss, mouth sores, nosebleeds, sinus pressure, sinus pain, sneezing, tinnitus and trouble swallowing.   Eyes: Negative.   Respiratory: Positive for cough. Negative for apnea, choking, chest tightness, shortness of breath, wheezing and stridor.    Cardiovascular: Negative.   Psychiatric/Behavioral: Negative.     Per HPI unless specifically indicated above     Objective:    BP (!) 159/88   Temp 97.7 F (36.5 C) (Temporal)   LMP  (LMP Unknown)   Wt Readings from Last 3 Encounters:  01/31/20 187 lb (84.8 kg)  07/05/19 181 lb 6 oz (82.3 kg)  12/02/18 184 lb 3.2 oz (83.6 kg)    Physical Exam Vitals and nursing note reviewed.  Constitutional:      General: She is not in acute distress.    Appearance: Normal appearance. She is not ill-appearing, toxic-appearing or diaphoretic.  HENT:     Head: Normocephalic and atraumatic.     Comments: Hoarse    Right Ear: External ear normal.     Left Ear: External ear normal.     Nose: Nose normal.     Mouth/Throat:     Mouth: Mucous membranes are moist.     Pharynx: Oropharynx is clear.  Eyes:     General: No scleral icterus.       Right eye: No discharge.        Left eye: No discharge.     Conjunctiva/sclera: Conjunctivae normal.     Pupils: Pupils are equal, round, and reactive to light.  Pulmonary:     Effort: Pulmonary effort is normal. No respiratory distress.     Comments: Speaking in full sentences Musculoskeletal:        General: Normal range of motion.     Cervical back: Normal range of motion.  Skin:    Coloration: Skin is not jaundiced or pale.     Findings: No bruising, erythema, lesion or rash.  Neurological:     Mental Status: She is alert and oriented to person, place, and time. Mental status is at baseline.  Psychiatric:        Mood and Affect: Mood normal.        Behavior: Behavior normal.        Thought Content: Thought content normal.        Judgment: Judgment normal.     Results for orders placed or performed in visit on 01/31/20  Comprehensive metabolic panel  Result Value Ref Range   Glucose 149 (H) 65 - 99 mg/dL   BUN 15 8 - 27 mg/dL   Creatinine, Ser 5.62 0.57 - 1.00 mg/dL   GFR calc non Af Amer 93 >59 mL/min/1.73   GFR calc Af Amer 107 >59  mL/min/1.73   BUN/Creatinine Ratio 22 12 - 28   Sodium 143 134 - 144 mmol/L   Potassium 4.3 3.5 - 5.2 mmol/L   Chloride 103 96 - 106 mmol/L   CO2 25 20 - 29 mmol/L   Calcium 9.4 8.7 - 10.3 mg/dL   Total Protein 6.8 6.0 - 8.5 g/dL   Albumin 4.3 3.8 - 4.8 g/dL   Globulin, Total 2.5 1.5 - 4.5 g/dL   Albumin/Globulin Ratio 1.7 1.2 - 2.2   Bilirubin Total 0.3 0.0 - 1.2 mg/dL   Alkaline Phosphatase 105 44 - 121 IU/L   AST 16 0 - 40 IU/L   ALT 17 0 - 32 IU/L  Lipid Panel w/o Chol/HDL Ratio  Result Value Ref Range   Cholesterol, Total 148 100 - 199 mg/dL   Triglycerides 130 (H) 0 - 149 mg/dL   HDL 48 >86 mg/dL   VLDL Cholesterol Cal 33 5 - 40 mg/dL   LDL Chol Calc (NIH) 67 0 - 99 mg/dL      Assessment & Plan:   Problem List Items Addressed This Visit   None   Visit Diagnoses    Suspected COVID-19 virus infection    -  Primary   Will get swabbed. Self-quarantine until results are back. Will treat with prednisone, tussionex, tessalon and lidocaine for comfort. Call if not getting better.   Relevant Orders   Novel Coronavirus, NAA ([facility])       Follow up plan: Return if symptoms worsen or fail to improve.    . This visit was completed via telephone due to the restrictions of the COVID-19 pandemic. All issues as above were discussed and addressed but no physical exam was performed. If it was felt that the patient should be evaluated in the office, they were directed there. The patient verbally consented to this visit. Patient was unable to complete an audio/visual visit due to Lack of equipment. Due to the catastrophic nature of the COVID-19 pandemic, this visit was done through audio contact only. . Location of the patient: home . Location of the provider: home . Those involved with this call:  . Provider: Olevia Perches, DO . CMA: Rolley Sims, CMA . Front Desk/Registration: Harriet Pho  . Time spent on call: 21 minutes on the phone discussing health concerns. 30 minutes  total spent in review of patient's record and preparation of their chart.

## 2020-03-14 NOTE — Patient Instructions (Signed)
To schedule your appointment, please visit [email or web address] or call [phone] (Mon. - Fri. 7 a.m. to 7 p.m) 

## 2020-03-15 ENCOUNTER — Other Ambulatory Visit: Payer: Managed Care, Other (non HMO)

## 2020-03-15 ENCOUNTER — Other Ambulatory Visit: Payer: Self-pay

## 2020-03-15 DIAGNOSIS — Z20822 Contact with and (suspected) exposure to covid-19: Secondary | ICD-10-CM

## 2020-03-17 ENCOUNTER — Encounter: Payer: Self-pay | Admitting: Family Medicine

## 2020-03-17 ENCOUNTER — Other Ambulatory Visit: Payer: Self-pay | Admitting: Family Medicine

## 2020-03-17 DIAGNOSIS — U071 COVID-19: Secondary | ICD-10-CM

## 2020-03-17 LAB — SARS-COV-2, NAA 2 DAY TAT

## 2020-03-17 LAB — NOVEL CORONAVIRUS, NAA: SARS-CoV-2, NAA: DETECTED — AB

## 2020-05-17 ENCOUNTER — Other Ambulatory Visit: Payer: Self-pay | Admitting: Family Medicine

## 2020-06-17 ENCOUNTER — Encounter: Payer: Self-pay | Admitting: Family Medicine

## 2020-06-25 LAB — COLOGUARD: Cologuard: NEGATIVE

## 2020-06-28 ENCOUNTER — Telehealth: Payer: Self-pay

## 2020-06-28 NOTE — Telephone Encounter (Signed)
Called pt to schedule cpe but pt is on medicare as of 5/1 her bday is [birth year] should she be scheduled as a welcome to medicare. If so does Beth Braun do these. Pt did ask to stay with [name] however she agreed to try to see Beth James. Please advise

## 2020-06-29 NOTE — Telephone Encounter (Signed)
Usually medicare starts the month of her birthday- so if shes going to be on medicare at her appointment she should have a welcome to medicare (which Beth James does do I think). If she's not on medicare at her appointment, then she can have 1 last regular physical

## 2020-06-29 NOTE — Telephone Encounter (Signed)
scheduled

## 2020-06-30 LAB — COLOGUARD
COLOGUARD: NEGATIVE
Cologuard: NEGATIVE

## 2020-06-30 LAB — EXTERNAL GENERIC LAB PROCEDURE: COLOGUARD: NEGATIVE

## 2020-07-10 ENCOUNTER — Telehealth: Payer: Self-pay

## 2020-07-10 ENCOUNTER — Ambulatory Visit: Payer: Self-pay | Admitting: Nurse Practitioner

## 2020-07-10 NOTE — Telephone Encounter (Signed)
Patient said she missed a call from Hendricks Limes, LPN. Please call her back at [phone]

## 2020-07-10 NOTE — Telephone Encounter (Signed)
I spoke with this pt, it was regarding her mother, who is a pt here.

## 2020-07-13 NOTE — Progress Notes (Signed)
BP (!) 142/92   Pulse 78   Temp 98.4 F (36.9 C)   Ht 4' 11.45" (1.51 m)   Wt 185 lb 6 oz (84.1 kg)   LMP  (LMP Unknown)   SpO2 95%   BMI 36.88 kg/m    Subjective:    Patient ID: Beth James, female    DOB: 01-04-1956, 65 y.o.   MRN: [id number]  HPI: Beth James is a 65 y.o. female presenting on 07/14/2020 for comprehensive medical examination. Current medical complaints include:none  She currently lives with: Husband Menopausal Symptoms: no  HYPERTENSION / HYPERLIPIDEMIA Satisfied with current treatment? yes Duration of hypertension: years BP monitoring frequency: occassionally BP range: 140/80 BP medication side effects: no Past BP meds: none Duration of hyperlipidemia: years Cholesterol medication side effects: no Cholesterol supplements: red yeast rice Past cholesterol medications: atorvastain (lipitor) Medication compliance: excellent compliance Aspirin: no Recent stressors: yes Recurrent headaches: no Visual changes: no Palpitations: no Dyspnea: no Chest pain: no Lower extremity edema: no Dizzy/lightheaded: no  Patient complains of muscle aches. Feels like muscles are very tender all of the time.  Denies stiff hands in the morning.  Denies weight gain. Patient reports fatigue or lack of energy.  Feels like she has to make herself do things. Wondering if her arthritis is more than osteoarthritis.  Has been told in the past that she has Fibromyalgia.  Has never pursued treatment.  Patient states the Voltaren helps with her pain.  She takes it once daily.  However, she has been having some GI upset and wondering if it is from the Voltaren.   Functional Status Survey: Is the patient deaf or have difficulty hearing?: No Does the patient have difficulty seeing, even when wearing glasses/contacts?: No Does the patient have difficulty concentrating, remembering, or making decisions?: No Does the patient have difficulty walking or climbing stairs?: No Does  the patient have difficulty dressing or bathing?: No Does the patient have difficulty doing errands alone such as visiting a doctor's office or shopping?: No   Fall Risk  07/14/2020 01/31/2020 12/02/2018 08/15/2015  Falls in the past year? 0 0 0 No  Number falls in past yr: 0 0 - -  Injury with Fall? 0 0 - -  Risk for fall due to : No Fall Risks - - -  Follow up Falls evaluation completed Falls evaluation completed Falls evaluation completed -    Depression Screen Depression screen [facility] 2/9 07/14/2020 01/31/2020 07/05/2019 01/01/2019 10/03/2017  Decreased Interest 0 0 0 0 0  Down, Depressed, Hopeless 0 0 3 0 0  PHQ - 2 Score 0 0 3 0 0  Altered sleeping 0 - 0 0 0  Tired, decreased energy 2 - 3 0 1  Change in appetite 0 - 0 0 0  Feeling bad or failure about yourself  0 - 0 0 0  Trouble concentrating 0 - 0 0 0  Moving slowly or fidgety/restless 0 - 0 0 0  Suicidal thoughts 0 - 0 0 0  PHQ-9 Score 2 - 6 0 1  Difficult doing work/chores Not difficult at all - Not difficult at all Not difficult at all -     Advanced Directives Does patient have a HCPOA?    no If yes, name and contact information:  Does patient have a living will or MOST form?  no  Past Medical History:  History reviewed. No pertinent past medical history.  Surgical History:  Past Surgical History:  Procedure Laterality Date  .  bone spur removal     left shoulder  . cartilage removal     left knee  . Clogged Tear Duct Stint placed      Medications:  Current Outpatient Medications on File Prior to Visit  Medication Sig  . Multiple Vitamin (MULTIVITAMIN) tablet Take 1 tablet by mouth daily.  . Red Yeast Rice 600 MG CAPS Take by mouth. Co Q 10  and omega 3 added to the supplement  . UNABLE TO FIND Hemp Oil liquid capsules  . fluticasone (FLONASE) 50 MCG/ACT nasal spray Place 2 sprays into both nostrils daily.   No current facility-administered medications on file prior to visit.    Allergies:  Allergies  Allergen  Reactions  . Amlodipine Other (See Comments)    jittery  . Codeine Other (See Comments)    jittery  . Lipitor [Atorvastatin] Other (See Comments)    Myalgias  . Lisinopril Other (See Comments)    myalgias  . Tape Rash    Needs to use paper tape    Social History:  Social History   Socioeconomic History  . Marital status: Married    Spouse name: Not on file  . Number of children: Not on file  . Years of education: Not on file  . Highest education level: Not on file  Occupational History  . Not on file  Tobacco Use  . Smoking status: Never Smoker  . Smokeless tobacco: Never Used  Vaping Use  . Vaping Use: Never used  Substance and Sexual Activity  . Alcohol use: No    Alcohol/week: 0.0 standard drinks  . Drug use: No  . Sexual activity: Yes  Other Topics Concern  . Not on file  Social History Narrative  . Not on file   Social Determinants of Health   Financial Resource Strain: Not on file  Food Insecurity: Not on file  Transportation Needs: Not on file  Physical Activity: Not on file  Stress: Not on file  Social Connections: Not on file  Intimate Partner Violence: Not on file   Social History   Tobacco Use  Smoking Status Never Smoker  Smokeless Tobacco Never Used   Social History   Substance and Sexual Activity  Alcohol Use No  . Alcohol/week: 0.0 standard drinks    Family History:  Family History  Problem Relation Age of Onset  . Heart disease Mother        CHF  . Heart disease Father   . Cancer Father        lung  . Heart disease Maternal Grandmother        CHF  . Cancer Other        colon    Past medical history, surgical history, medications, allergies, family history and social history reviewed with patient today and changes made to appropriate areas of the chart.   Review of Systems  Constitutional: Positive for malaise/fatigue.  Eyes: Negative for blurred vision and double vision.  Respiratory: Negative for shortness of breath.    Cardiovascular: Negative for chest pain, palpitations and leg swelling.  Musculoskeletal: Positive for myalgias.  Neurological: Negative for dizziness and headaches.    All other ROS negative except what is listed above and in the HPI.      Objective:    BP (!) 142/92   Pulse 78   Temp 98.4 F (36.9 C)   Ht 4' 11.45" (1.51 m)   Wt 185 lb 6 oz (84.1 kg)   LMP  (LMP Unknown)  SpO2 95%   BMI 36.88 kg/m   Wt Readings from Last 3 Encounters:  07/14/20 185 lb 6 oz (84.1 kg)  01/31/20 187 lb (84.8 kg)  07/05/19 181 lb 6 oz (82.3 kg)    No exam data present  Physical Exam Vitals and nursing note reviewed.  Constitutional:      General: She is awake. She is not in acute distress.    Appearance: She is well-developed. She is not ill-appearing.  HENT:     Head: Normocephalic and atraumatic.     Right Ear: Hearing, tympanic membrane, ear canal and external ear normal. No drainage.     Left Ear: Hearing, tympanic membrane, ear canal and external ear normal. No drainage.     Nose: Nose normal.     Right Sinus: No maxillary sinus tenderness or frontal sinus tenderness.     Left Sinus: No maxillary sinus tenderness or frontal sinus tenderness.     Mouth/Throat:     Mouth: Mucous membranes are moist.     Pharynx: Oropharynx is clear. Uvula midline. No pharyngeal swelling, oropharyngeal exudate or posterior oropharyngeal erythema.  Eyes:     General: Lids are normal.        Right eye: No discharge.        Left eye: No discharge.     Extraocular Movements: Extraocular movements intact.     Conjunctiva/sclera: Conjunctivae normal.     Pupils: Pupils are equal, round, and reactive to light.     Visual Fields: Right eye visual fields normal and left eye visual fields normal.  Neck:     Thyroid: No thyromegaly.     Vascular: No carotid bruit.     Trachea: Trachea normal.  Cardiovascular:     Rate and Rhythm: Normal rate and regular rhythm.     Heart sounds: Normal heart sounds. No  murmur heard. No gallop.   Pulmonary:     Effort: Pulmonary effort is normal. No accessory muscle usage or respiratory distress.     Breath sounds: Normal breath sounds.  Chest:  Breasts:     Right: Normal. No axillary adenopathy or supraclavicular adenopathy.     Left: Normal. No axillary adenopathy or supraclavicular adenopathy.    Abdominal:     General: Bowel sounds are normal.     Palpations: Abdomen is soft. There is no hepatomegaly or splenomegaly.     Tenderness: There is no abdominal tenderness.  Musculoskeletal:        General: Normal range of motion.     Cervical back: Normal range of motion and neck supple.     Right lower leg: No edema.     Left lower leg: No edema.  Lymphadenopathy:     Head:     Right side of head: No submental, submandibular, tonsillar, preauricular or posterior auricular adenopathy.     Left side of head: No submental, submandibular, tonsillar, preauricular or posterior auricular adenopathy.     Cervical: No cervical adenopathy.     Upper Body:     Right upper body: No supraclavicular, axillary or pectoral adenopathy.     Left upper body: No supraclavicular, axillary or pectoral adenopathy.  Skin:    General: Skin is warm and dry.     Capillary Refill: Capillary refill takes less than 2 seconds.     Findings: No rash.  Neurological:     Mental Status: She is alert and oriented to person, place, and time.     Cranial Nerves: Cranial nerves are  intact.     Gait: Gait is intact.     Deep Tendon Reflexes: Reflexes are normal and symmetric.     Reflex Scores:      Brachioradialis reflexes are 2+ on the right side and 2+ on the left side.      Patellar reflexes are 2+ on the right side and 2+ on the left side. Psychiatric:        Attention and Perception: Attention normal.        Mood and Affect: Mood normal.        Speech: Speech normal.        Behavior: Behavior normal. Behavior is cooperative.        Thought Content: Thought content normal.         Judgment: Judgment normal.     No flowsheet data found.  Cognitive Testing - 6-CIT  Correct? Score   What year is it? yes 0 Yes = 0    No = 4  What month is it? yes 0 Yes = 0    No = 3  Remember:     Pia [address]au, [place], [place]     What time is it? yes 0 Yes = 0    No = 3  Count backwards from 20 to 1 yes 0 Correct = 0    1 error = 2   More than 1 error = 4  Say the months of the year in reverse. yes 0 Correct = 0    1 error = 2   More than 1 error = 4  What address did I ask you to remember? yes 0 Correct = 0  1 error = 2    2 error = 4    3 error = 6    4 error = 8    All wrong = 10       TOTAL SCORE  0/28   Interpretation:  Normal  Normal (0-7) Abnormal (8-28)   Results for orders placed or performed in visit on 07/03/20  Cologuard  Result Value Ref Range   Cologuard Negative Negative      Assessment & Plan:   Problem List Items Addressed This Visit      Cardiovascular and Mediastinum   Essential hypertension    BP still running a little high.  Will to try Metoprolol daily.  Side effects and benefits discussed with patient during visit.  Discussed that NSAIDS can elevated blood pressure.  Lab work ordered today to determine cause of patient's pain.  Would like to D/C NSAID use in the future.  Will work on [employer] and continue to monitor. Will start monitoring at home and follow up in 1 month for reevaluation.      Relevant Medications   metoprolol succinate (TOPROL-XL) 25 MG 24 hr tablet   Other Relevant Orders   CBC with Differential/Platelet   Lipid panel   TSH   Urinalysis, Routine w reflex microscopic     Other   Obesity    Recommend diet and exercise to aid in weight loss.      Relevant Orders   CBC with Differential/Platelet   Comprehensive metabolic panel   Lipid panel   TSH   Urinalysis, Routine w reflex microscopic   Anxiety    Under good control on current regimen. Continue current regimen. Continue to monitor. Call with any  concerns. Refills given. Labs drawn today.        Relevant Medications  sertraline (ZOLOFT) 25 MG tablet   Other Relevant Orders   CBC with Differential/Platelet   Lipid panel   TSH   Urinalysis, Routine w reflex microscopic   Hyperlipidemia    Under good control on current regimen. Continue current regimen. Continue to monitor. Call with any concerns. Refills given. Labs drawn today.  Not able to tolerate STATIN.  Can consider Zetia if necessary.       Relevant Medications   metoprolol succinate (TOPROL-XL) 25 MG 24 hr tablet   Other Relevant Orders   CBC with Differential/Platelet   Comprehensive metabolic panel   Lipid panel   TSH   Urinalysis, Routine w reflex microscopic    Other Visit Diagnoses    Welcome to Medicare preventive visit    -  Primary   Reviewed ACP, HM and screening. Declined vaccines.  Labs ordered today.    Relevant Orders   EKG 12-Lead (Completed)   Breast cancer screening by mammogram       Relevant Orders   MM Digital Screening   Screening for osteoporosis       Generalized muscle ache       Labs ordered today.  Will rule out autoimmune disorder. Will make further recommendations based on lab results. Discussed S/E of NSAIDS. Will try Celebrex.   Relevant Orders   Antinuclear Antib (ANA)   Sed Rate (ESR)   C-reactive protein   Fatigue, unspecified type       Relevant Orders   Antinuclear Antib (ANA)   Sed Rate (ESR)   C-reactive protein       Preventative Services:  AAA screening: NA Health Risk Assessment and Personalized Prevention Plan: Up to date Bone Mass Measurements: Ordered Dexa Breast Cancer Screening: Ordered Mammogram CVD Screening: Ordered Cervical Cancer Screening: Due 2023 Colon Cancer Screening: Up to date Depression Screening: Ordered today Diabetes Screening: NA Glaucoma Screening: NA Hepatitis B vaccine: NA Hepatitis C screening: Up to date HIV Screening: Up to date Flu Vaccine: postponed till flu season Lung  cancer Screening: NA Obesity Screening: Up to Date Pneumonia Vaccines (2): Refused STI Screening: Denies  Follow up plan: Return in about 1 month (around 08/14/2020) for BP Check, muscle aches follow up.   LABORATORY TESTING:  - Pap smear: up to date  IMMUNIZATIONS:   - Tdap: Tetanus vaccination status reviewed: last tetanus booster within 10 years. - Influenza: Postponed to flu season - Pneumovax: Refused - Prevnar: Not applicable - Zostavax vaccine: Refused  SCREENING: -Mammogram: Ordered today  - Colonoscopy: Up to date  - Bone Density: Ordered today  -Hearing Test: Up to date  -Spirometry: Not applicable   PATIENT COUNSELING:   Advised to take 1 mg of folate supplement per day if capable of pregnancy.   Sexuality: Discussed sexually transmitted diseases, partner selection, use of condoms, avoidance of unintended pregnancy  and contraceptive alternatives.   Advised to avoid cigarette smoking.  I discussed with the patient that most people either abstain from alcohol or drink within James limits (<=14/week and <=4 drinks/occasion for males, <=7/weeks and <= 3 drinks/occasion for females) and that the risk for alcohol disorders and other health effects rises proportionally with the number of drinks per week and how often a drinker exceeds daily limits.  Discussed cessation/primary prevention of drug use and availability of treatment for abuse.   Diet: Encouraged to adjust caloric intake to maintain  or achieve ideal body weight, to reduce intake of dietary saturated fat and total fat, to limit sodium intake by  avoiding high sodium foods and not adding table salt, and to maintain adequate dietary potassium and calcium preferably from fresh fruits, vegetables, and low-fat dairy products.    stressed the importance of regular exercise  Injury prevention: Discussed safety belts, safety helmets, smoke detector, smoking near bedding or upholstery.   Dental health: Discussed  importance of regular tooth brushing, flossing, and dental visits.    NEXT PREVENTATIVE PHYSICAL DUE IN 1 YEAR. Return in about 1 month (around 08/14/2020) for BP Check, muscle aches follow up.

## 2020-07-14 ENCOUNTER — Other Ambulatory Visit: Payer: Self-pay

## 2020-07-14 ENCOUNTER — Other Ambulatory Visit: Payer: Self-pay | Admitting: Family Medicine

## 2020-07-14 ENCOUNTER — Encounter: Payer: Self-pay | Admitting: Nurse Practitioner

## 2020-07-14 ENCOUNTER — Ambulatory Visit (INDEPENDENT_AMBULATORY_CARE_PROVIDER_SITE_OTHER): Payer: Medicare Other | Admitting: Nurse Practitioner

## 2020-07-14 VITALS — BP 142/92 | HR 78 | Temp 98.4°F | Ht 59.45 in | Wt 185.4 lb

## 2020-07-14 DIAGNOSIS — R8290 Unspecified abnormal findings in urine: Secondary | ICD-10-CM | POA: Diagnosis not present

## 2020-07-14 DIAGNOSIS — Z0000 Encounter for general adult medical examination without abnormal findings: Secondary | ICD-10-CM

## 2020-07-14 DIAGNOSIS — Z13820 Encounter for screening for osteoporosis: Secondary | ICD-10-CM | POA: Diagnosis not present

## 2020-07-14 DIAGNOSIS — R5383 Other fatigue: Secondary | ICD-10-CM | POA: Diagnosis not present

## 2020-07-14 DIAGNOSIS — I10 Essential (primary) hypertension: Secondary | ICD-10-CM

## 2020-07-14 DIAGNOSIS — M791 Myalgia, unspecified site: Secondary | ICD-10-CM | POA: Diagnosis not present

## 2020-07-14 DIAGNOSIS — Z1231 Encounter for screening mammogram for malignant neoplasm of breast: Secondary | ICD-10-CM

## 2020-07-14 DIAGNOSIS — E66812 Obesity, class 2: Secondary | ICD-10-CM

## 2020-07-14 DIAGNOSIS — E782 Mixed hyperlipidemia: Secondary | ICD-10-CM | POA: Diagnosis not present

## 2020-07-14 DIAGNOSIS — Z6836 Body mass index (BMI) 36.0-36.9, adult: Secondary | ICD-10-CM

## 2020-07-14 DIAGNOSIS — F419 Anxiety disorder, unspecified: Secondary | ICD-10-CM

## 2020-07-14 LAB — MICROSCOPIC EXAMINATION

## 2020-07-14 LAB — URINALYSIS, ROUTINE W REFLEX MICROSCOPIC
Bilirubin, UA: NEGATIVE
Glucose, UA: NEGATIVE
Ketones, UA: NEGATIVE
Nitrite, UA: NEGATIVE
Protein,UA: NEGATIVE
Specific Gravity, UA: 1.015 (ref 1.005–1.030)
Urobilinogen, Ur: 0.2 mg/dL (ref 0.2–1.0)
pH, UA: 7 (ref 5.0–7.5)

## 2020-07-14 MED ORDER — METOPROLOL SUCCINATE ER 25 MG PO TB24: 25.0000 mg | ORAL_TABLET | Freq: Every day | ORAL | 0 refills | Status: DC

## 2020-07-14 MED ORDER — ESOMEPRAZOLE MAGNESIUM 20 MG PO CPDR: 20.0000 mg | DELAYED_RELEASE_CAPSULE | Freq: Every day | ORAL | 1 refills | Status: DC

## 2020-07-14 MED ORDER — SERTRALINE HCL 25 MG PO TABS: ORAL_TABLET | ORAL | 1 refills | Status: DC

## 2020-07-14 MED ORDER — CELECOXIB 50 MG PO CAPS: 50.0000 mg | ORAL_CAPSULE | Freq: Every day | ORAL | 1 refills | Status: DC

## 2020-07-14 NOTE — Addendum Note (Signed)
Addended by: Larae Grooms on: 07/14/2020 01:48 PM   Modules accepted: Level of Service

## 2020-07-14 NOTE — Addendum Note (Signed)
Addended by: Larae Grooms on: 07/14/2020 01:40 PM   Modules accepted: Orders

## 2020-07-14 NOTE — Progress Notes (Signed)
Hi Beth James.  Your urine showed some blood and white blood cells.  This could indicate a UTI.  I have sent it for culture to see if you do have one and if so, I will send you an antibiotic once the results are back.

## 2020-07-14 NOTE — Telephone Encounter (Signed)
Requested medication (s) are due for refill today:   Yes  Requested medication (s) are on the active medication list:   Yes  Future visit scheduled:   Has appt today at 10:20 5/20 with Clydie Braun   Last ordered: 6 months ago  Returned since she has an appt with Clydie Braun today at 10:20 address during OV   Requested Prescriptions  Pending Prescriptions Disp Refills   sertraline (ZOLOFT) 25 MG tablet [Pharmacy Med Name: SERTRALINE 25MG  TABLETS] 90 tablet 1    Sig: TAKE 1 TABLET(25 MG) BY MOUTH DAILY      Psychiatry:  Antidepressants - SSRI Passed - 07/14/2020  7:11 AM      Passed - Valid encounter within last 6 months    Recent Outpatient Visits           4 months ago Suspected COVID-19 virus infection   [facility] [place], [place], DO   5 months ago Acute non-recurrent maxillary sinusitis   [facility] [place], Megan P, DO   6 months ago Suspected COVID-19 virus infection   [facility] [place], [place], DO   1 year ago Essential hypertension   [facility] [place], Particia Nearing   1 year ago Essential hypertension   [facility] [place], [place], Salley Hews       Future Appointments             Today [place], NP [facility], [facility]

## 2020-07-14 NOTE — Assessment & Plan Note (Signed)
Under good control on current regimen. Continue current regimen. Continue to monitor. Call with any concerns. Refills given. Labs drawn today.  Not able to tolerate STATIN.  Can consider Zetia if necessary.

## 2020-07-14 NOTE — Assessment & Plan Note (Signed)
Under good control on current regimen. Continue current regimen. Continue to monitor. Call with any concerns. Refills given. Labs drawn today.   

## 2020-07-14 NOTE — Patient Instructions (Signed)
Preventative Services:  AAA screening: NA Health Risk Assessment and Personalized Prevention Plan: Up to date Bone Mass Measurements: Ordered Dexa Breast Cancer Screening: Ordered Mammogram CVD Screening: Ordered Cervical Cancer Screening: Due 2023 Colon Cancer Screening: Up to date Depression Screening: Ordered today Diabetes Screening: NA Glaucoma Screening: NA Hepatitis B vaccine: NA Hepatitis C screening: Up to date HIV Screening: Up to date Flu Vaccine: postponed till flu season Lung cancer Screening: NA Obesity Screening: Up to Date Pneumonia Vaccines (2): Refused STI Screening: Denies

## 2020-07-14 NOTE — Progress Notes (Signed)
NSR.  Reviewed with patient in office.

## 2020-07-14 NOTE — Assessment & Plan Note (Signed)
Recommend diet and exercise to aid in weight loss.

## 2020-07-14 NOTE — Assessment & Plan Note (Signed)
BP still running a little high.  Will to try Metoprolol daily.  Side effects and benefits discussed with patient during visit.  Discussed that NSAIDS can elevated blood pressure.  Lab work ordered today to determine cause of patient's pain.  Would like to D/C NSAID use in the future.  Will work on [employer] and continue to monitor. Will start monitoring at home and follow up in 1 month for reevaluation.

## 2020-07-15 LAB — CBC WITH DIFFERENTIAL/PLATELET
Basophils Absolute: 0.1 10*3/uL (ref 0.0–0.2)
Basos: 1 %
EOS (ABSOLUTE): 0.1 10*3/uL (ref 0.0–0.4)
Eos: 1 %
Hematocrit: 43.1 % (ref 34.0–46.6)
Hemoglobin: 13.8 g/dL (ref 11.1–15.9)
Immature Grans (Abs): 0 10*3/uL (ref 0.0–0.1)
Immature Granulocytes: 0 %
Lymphocytes Absolute: 2.5 10*3/uL (ref 0.7–3.1)
Lymphs: 33 %
MCH: 28.6 pg (ref 26.6–33.0)
MCHC: 32 g/dL (ref 31.5–35.7)
MCV: 89 fL (ref 79–97)
Monocytes Absolute: 0.7 10*3/uL (ref 0.1–0.9)
Monocytes: 9 %
Neutrophils Absolute: 4.3 10*3/uL (ref 1.4–7.0)
Neutrophils: 56 %
Platelets: 374 10*3/uL (ref 150–450)
RBC: 4.82 x10E6/uL (ref 3.77–5.28)
RDW: 12.9 % (ref 11.7–15.4)
WBC: 7.6 10*3/uL (ref 3.4–10.8)

## 2020-07-15 LAB — TSH: TSH: 1.16 u[IU]/mL (ref 0.450–4.500)

## 2020-07-15 LAB — COMPREHENSIVE METABOLIC PANEL
ALT: 15 IU/L (ref 0–32)
AST: 13 IU/L (ref 0–40)
Albumin/Globulin Ratio: 1.8 (ref 1.2–2.2)
Albumin: 4.5 g/dL (ref 3.8–4.8)
Alkaline Phosphatase: 111 IU/L (ref 44–121)
BUN/Creatinine Ratio: 23 (ref 12–28)
BUN: 16 mg/dL (ref 8–27)
Bilirubin Total: 0.4 mg/dL (ref 0.0–1.2)
CO2: 22 mmol/L (ref 20–29)
Calcium: 9.4 mg/dL (ref 8.7–10.3)
Chloride: 101 mmol/L (ref 96–106)
Creatinine, Ser: 0.69 mg/dL (ref 0.57–1.00)
Globulin, Total: 2.5 g/dL (ref 1.5–4.5)
Glucose: 81 mg/dL (ref 65–99)
Potassium: 4.6 mmol/L (ref 3.5–5.2)
Sodium: 142 mmol/L (ref 134–144)
Total Protein: 7 g/dL (ref 6.0–8.5)
eGFR: 96 mL/min/{1.73_m2} (ref >59)

## 2020-07-15 LAB — LIPID PANEL
Chol/HDL Ratio: 6 ratio — ABNORMAL HIGH (ref 0.0–4.4)
Cholesterol, Total: 252 mg/dL — ABNORMAL HIGH (ref 100–199)
HDL: 42 mg/dL (ref >39)
LDL Chol Calc (NIH): 155 mg/dL — ABNORMAL HIGH (ref 0–99)
Triglycerides: 298 mg/dL — ABNORMAL HIGH (ref 0–149)
VLDL Cholesterol Cal: 55 mg/dL — ABNORMAL HIGH (ref 5–40)

## 2020-07-15 LAB — C-REACTIVE PROTEIN: CRP: 9 mg/L (ref 0–10)

## 2020-07-15 LAB — ANA: Anti Nuclear Antibody (ANA): NEGATIVE

## 2020-07-15 LAB — SEDIMENTATION RATE: Sed Rate: 13 mm/hr (ref 0–40)

## 2020-07-17 LAB — URINE CULTURE

## 2020-07-17 NOTE — Progress Notes (Signed)
Hi Beth James.  Your lab work shows that you do not have a UTI which is great news.  No need for antibiotics. Your complete blood count , thyroid and all the autoimmune labs are normal. Your liver, kidneys and electrolytes are normal.  As you already saw, your cholesterol is elevated from prior, especially your Triglycerides. I recommend decreasing processed foods and sugar intake. Again, if you can tolerate a statin that is my first recommendation.  I calculated your cardiac risk score which is below and you are in the high range.  Starting a statin will help lower this risk.  Please let me know what you think.  The 10-year ASCVD risk score Denman George DC Montez Hageman., et al., 2013) is: 11.5%   Values used to calculate the score:     Age: 65 years     Sex: Female     Is Non-Hispanic African American: No     Diabetic: No     Tobacco smoker: No     Systolic Blood Pressure: 142 mmHg     Is BP treated: Yes     HDL Cholesterol: 42 mg/dL     Total Cholesterol: 252 mg/dL

## 2020-07-18 MED ORDER — EZETIMIBE 10 MG PO TABS: 10.0000 mg | ORAL_TABLET | Freq: Every day | ORAL | 3 refills | Status: DC

## 2020-07-18 NOTE — Addendum Note (Signed)
Addended by: Larae Grooms on: 07/18/2020 07:00 PM   Modules accepted: Orders

## 2020-07-26 NOTE — Addendum Note (Signed)
Addended by: Larae Grooms on: 07/26/2020 03:46 PM   Modules accepted: Level of Service

## 2020-08-15 ENCOUNTER — Ambulatory Visit: Payer: Medicare Other | Admitting: Nurse Practitioner

## 2020-08-16 ENCOUNTER — Telehealth: Payer: Self-pay

## 2020-08-16 ENCOUNTER — Other Ambulatory Visit: Payer: Self-pay

## 2020-08-16 ENCOUNTER — Encounter: Payer: Self-pay | Admitting: Nurse Practitioner

## 2020-08-16 ENCOUNTER — Ambulatory Visit (INDEPENDENT_AMBULATORY_CARE_PROVIDER_SITE_OTHER): Payer: Medicare Other | Admitting: Nurse Practitioner

## 2020-08-16 VITALS — BP 136/84 | HR 94 | Temp 99.0°F | Wt 180.0 lb

## 2020-08-16 DIAGNOSIS — M159 Polyosteoarthritis, unspecified: Secondary | ICD-10-CM

## 2020-08-16 DIAGNOSIS — I10 Essential (primary) hypertension: Secondary | ICD-10-CM | POA: Diagnosis not present

## 2020-08-16 DIAGNOSIS — E782 Mixed hyperlipidemia: Secondary | ICD-10-CM | POA: Diagnosis not present

## 2020-08-16 MED ORDER — CELECOXIB 100 MG PO CAPS: 100.0000 mg | ORAL_CAPSULE | Freq: Every day | ORAL | 1 refills | Status: DC

## 2020-08-16 NOTE — Progress Notes (Signed)
BP 136/84   Pulse 94   Temp 99 F (37.2 C)   Wt 180 lb (81.6 kg)   LMP  (LMP Unknown)   SpO2 97%   BMI 35.81 kg/m    Subjective:    Patient ID: Beth James, female    DOB: 08/26/1955, 65 y.o.   MRN: [id number]  HPI: Beth James is a 65 y.o. female  Chief Complaint  Patient presents with   Hypertension   HYPERTENSION Hypertension status: controlled  Satisfied with current treatment? yes Duration of hypertension: years BP monitoring frequency:  daily BP range: 130/80 BP medication side effects:  no Medication compliance: excellent compliance Previous BP meds: Patient states she had a lot of dizziness with the metoprolol and is no longer taking it. Aspirin: no Recurrent headaches: no Visual changes: no Palpitations: no Dyspnea: no Chest pain: no Lower extremity edema: no Dizzy/lightheaded: no  HYPERLIPIDEMIA Patient started Zetia after last visit.  Patient states she is tolerating well.  Does have some Dizziness at times. She has started nutrisystem to help with weight loss. Has noticed that her blood pressure has been under control since starting the program.   Relevant past medical, surgical, family and social history reviewed and updated as indicated. Interim medical history since our last visit reviewed. Allergies and medications reviewed and updated.  Review of Systems  Eyes:  Negative for visual disturbance.  Respiratory:  Negative for cough, chest tightness and shortness of breath.   Cardiovascular:  Negative for chest pain, palpitations and leg swelling.  Neurological:  Negative for dizziness and headaches.   Per HPI unless specifically indicated above     Objective:    BP 136/84   Pulse 94   Temp 99 F (37.2 C)   Wt 180 lb (81.6 kg)   LMP  (LMP Unknown)   SpO2 97%   BMI 35.81 kg/m   Wt Readings from Last 3 Encounters:  08/16/20 180 lb (81.6 kg)  07/14/20 185 lb 6 oz (84.1 kg)  01/31/20 187 lb (84.8 kg)    Physical Exam Vitals  and nursing note reviewed.  Constitutional:      General: She is not in acute distress.    Appearance: Normal appearance. She is normal weight. She is not ill-appearing, toxic-appearing or diaphoretic.  HENT:     Head: Normocephalic.     Right Ear: External ear normal.     Left Ear: External ear normal.     Nose: Nose normal.     Mouth/Throat:     Mouth: Mucous membranes are moist.     Pharynx: Oropharynx is clear.  Eyes:     General:        Right eye: No discharge.        Left eye: No discharge.     Extraocular Movements: Extraocular movements intact.     Conjunctiva/sclera: Conjunctivae normal.     Pupils: Pupils are equal, round, and reactive to light.  Cardiovascular:     Rate and Rhythm: Normal rate and regular rhythm.     Heart sounds: No murmur heard. Pulmonary:     Effort: Pulmonary effort is normal. No respiratory distress.     Breath sounds: Normal breath sounds. No wheezing or rales.  Musculoskeletal:     Cervical back: Normal range of motion and neck supple.  Skin:    General: Skin is warm and dry.     Capillary Refill: Capillary refill takes less than 2 seconds.  Neurological:  General: No focal deficit present.     Mental Status: She is alert and oriented to person, place, and time. Mental status is at baseline.  Psychiatric:        Mood and Affect: Mood normal.        Behavior: Behavior normal.        Thought Content: Thought content normal.        Judgment: Judgment normal.    Results for orders placed or performed in visit on 07/14/20  Microscopic Examination   Urine  Result Value Ref Range   WBC, UA 6-10 (A) 0 - 5 /hpf   RBC 0-2 0 - 2 /hpf   Epithelial Cells (non renal) 0-10 0 - 10 /hpf   Renal Epithel, UA 0-10 (A) None seen /hpf   Mucus, UA Present (A) Not Estab.   Bacteria, UA Few (A) None seen/Few  Urine Culture   Specimen: Urine   UR  Result Value Ref Range   Urine Culture, Routine Final report    Organism ID, Bacteria Comment   CBC  with Differential/Platelet  Result Value Ref Range   WBC 7.6 3.4 - 10.8 x10E3/uL   RBC 4.82 3.77 - 5.28 x10E6/uL   Hemoglobin 13.8 11.1 - 15.9 g/dL   Hematocrit 43.1 34.0 - 46.6 %   MCV 89 79 - 97 fL   MCH 28.6 26.6 - 33.0 pg   MCHC 32.0 31.5 - 35.7 g/dL   RDW 12.9 11.7 - 15.4 %   Platelets 374 150 - 450 x10E3/uL   Neutrophils 56 Not Estab. %   Lymphs 33 Not Estab. %   Monocytes 9 Not Estab. %   Eos 1 Not Estab. %   Basos 1 Not Estab. %   Neutrophils Absolute 4.3 1.4 - 7.0 x10E3/uL   Lymphocytes Absolute 2.5 0.7 - 3.1 x10E3/uL   Monocytes Absolute 0.7 0.1 - 0.9 x10E3/uL   EOS (ABSOLUTE) 0.1 0.0 - 0.4 x10E3/uL   Basophils Absolute 0.1 0.0 - 0.2 x10E3/uL   Immature Granulocytes 0 Not Estab. %   Immature Grans (Abs) 0.0 0.0 - 0.1 x10E3/uL  Comprehensive metabolic panel  Result Value Ref Range   Glucose 81 65 - 99 mg/dL   BUN 16 8 - 27 mg/dL   Creatinine, Ser 0.69 0.57 - 1.00 mg/dL   eGFR 96 >59 mL/min/1.73   BUN/Creatinine Ratio 23 12 - 28   Sodium 142 134 - 144 mmol/L   Potassium 4.6 3.5 - 5.2 mmol/L   Chloride 101 96 - 106 mmol/L   CO2 22 20 - 29 mmol/L   Calcium 9.4 8.7 - 10.3 mg/dL   Total Protein 7.0 6.0 - 8.5 g/dL   Albumin 4.5 3.8 - 4.8 g/dL   Globulin, Total 2.5 1.5 - 4.5 g/dL   Albumin/Globulin Ratio 1.8 1.2 - 2.2   Bilirubin Total 0.4 0.0 - 1.2 mg/dL   Alkaline Phosphatase 111 44 - 121 IU/L   AST 13 0 - 40 IU/L   ALT 15 0 - 32 IU/L  Lipid panel  Result Value Ref Range   Cholesterol, Total 252 (H) 100 - 199 mg/dL   Triglycerides 298 (H) 0 - 149 mg/dL   HDL 42 >39 mg/dL   VLDL Cholesterol Cal 55 (H) 5 - 40 mg/dL   LDL Chol Calc (NIH) 155 (H) 0 - 99 mg/dL   Chol/HDL Ratio 6.0 (H) 0.0 - 4.4 ratio  TSH  Result Value Ref Range   TSH 1.160 0.450 - 4.500 uIU/mL  Urinalysis,  Routine w reflex microscopic  Result Value Ref Range   Specific Gravity, UA 1.015 1.005 - 1.030   pH, UA 7.0 5.0 - 7.5   Color, UA Yellow Yellow   Appearance Ur Cloudy (A) Clear    Leukocytes,UA 1+ (A) Negative   Protein,UA Negative Negative/Trace   Glucose, UA Negative Negative   Ketones, UA Negative Negative   RBC, UA Trace (A) Negative   Bilirubin, UA Negative Negative   Urobilinogen, Ur 0.2 0.2 - 1.0 mg/dL   Nitrite, UA Negative Negative   Microscopic Examination See below:   Antinuclear Antib (ANA)  Result Value Ref Range   Anti Nuclear Antibody (ANA) Negative Negative  Sed Rate (ESR)  Result Value Ref Range   Sed Rate 13 0 - 40 mm/hr  C-reactive protein  Result Value Ref Range   CRP 9 0 - 10 mg/L      Assessment & Plan:   Problem List Items Addressed This Visit       Cardiovascular and Mediastinum   Essential hypertension    Chronic. Controlled without medication. Patient did not tolerate the metoprolol. Has been controlling with diet and exercise. Continue to check blood pressures at home. If blood pressure is >140/90 patient should call the office. Advised patient that increasing celebrex can increase her blood pressure.          Musculoskeletal and Integument   Generalized OA - Primary    Chronic.  Patient is having knee replacement in September.  Agreed to increase Celebrex to 147m daily until then. The goal is for patient to come off Celebrex once the knee replacement is done.        Relevant Medications   celecoxib (CELEBREX) 100 MG capsule     Other   Hyperlipidemia    Chronic.  Patient tolerating Zetia well. Will recheck labs at next visit.          Follow up plan: Return in about 6 months (around 02/15/2021) for lipid check, BP Check.   A total of 30 minutes were spent on this encounter today.  When total time is documented, this includes both the face-to-face and non-face-to-face time personally spent before, during and after the visit on the date of the encounter.

## 2020-08-16 NOTE — Assessment & Plan Note (Signed)
Chronic.  Patient tolerating Zetia well. Will recheck labs at next visit.

## 2020-08-16 NOTE — Telephone Encounter (Signed)
Copied from CRM [id number]. Topic: General - Other >> Aug 16, 2020  4:02 PM Gaetana Michaelis A wrote: Reason for CRM: Patient is currently at their pharmacy and has been told that a prior authorization is required for their celecoxib (CELEBREX) 100 MG capsule  prescription  Please contact patient or patient's pharmacy for further assistance

## 2020-08-16 NOTE — Assessment & Plan Note (Signed)
Chronic. Controlled without medication. Patient did not tolerate the metoprolol. Has been controlling with diet and exercise. Continue to check blood pressures at home. If blood pressure is >140/90 patient should call the office. Advised patient that increasing celebrex can increase her blood pressure.

## 2020-08-16 NOTE — Assessment & Plan Note (Signed)
Chronic.  Patient is having knee replacement in September.  Agreed to increase Celebrex to 100mg  daily until then. The goal is for patient to come off Celebrex once the knee replacement is done.

## 2020-08-23 ENCOUNTER — Other Ambulatory Visit: Payer: Self-pay | Admitting: Nurse Practitioner

## 2020-08-23 NOTE — Telephone Encounter (Signed)
Pt has apt on 10/18/2020

## 2020-08-23 NOTE — Telephone Encounter (Signed)
Requested medication (s) are due for refill today: no too soon  Requested medication (s) are on the active medication list: yes  Last refill:  zetia- 07/18/20 #90 3 refills, celebrex 08/16/20 #90 1 refill  Future visit scheduled: yes in 6 months   Notes to clinic:  patient requesting both medications too soon , can patient get refill early on Rx?     Requested Prescriptions  Pending Prescriptions Disp Refills   ezetimibe (ZETIA) 10 MG tablet 90 tablet 3    Sig: Take 1 tablet (10 mg total) by mouth daily.      Cardiovascular:  Antilipid - Sterol Transport Inhibitors Failed - 08/23/2020  2:42 PM      Failed - Total Cholesterol in normal range and within 360 days    Cholesterol, Total  Date Value Ref Range Status  07/14/2020 252 (H) 100 - 199 mg/dL Final          Failed - LDL in normal range and within 360 days    LDL Chol Calc (NIH)  Date Value Ref Range Status  07/14/2020 155 (H) 0 - 99 mg/dL Final          Failed - Triglycerides in normal range and within 360 days    Triglycerides  Date Value Ref Range Status  07/14/2020 298 (H) 0 - 149 mg/dL Final          Passed - HDL in normal range and within 360 days    HDL  Date Value Ref Range Status  07/14/2020 42 >39 mg/dL Final          Passed - Valid encounter within last 12 months    Recent Outpatient Visits           1 week ago Generalized OA   [facility] Larae Grooms, NP   5 months ago Suspected COVID-19 virus infection   [facility] [place], [place], DO   6 months ago Acute non-recurrent maxillary sinusitis   [facility] [place], [place], DO   7 months ago Suspected COVID-19 virus infection   [facility] [place], [place], DO   1 year ago Essential hypertension   [facility] Particia Nearing, [place]       Future Appointments             In 6 months Larae Grooms, NP [facility], [facility]               celecoxib  (CELEBREX) 100 MG capsule 90 capsule 1    Sig: Take 1 capsule (100 mg total) by mouth daily.      Analgesics:  COX2 Inhibitors Passed - 08/23/2020  2:42 PM      Passed - HGB in normal range and within 360 days    Hemoglobin  Date Value Ref Range Status  07/14/2020 13.8 11.1 - 15.9 g/dL Final          Passed - Cr in normal range and within 360 days    Creatinine, Ser  Date Value Ref Range Status  07/14/2020 0.69 0.57 - 1.00 mg/dL Final          Passed - Patient is not pregnant      Passed - Valid encounter within last 12 months    Recent Outpatient Visits           1 week ago Generalized OA   [facility] [place], Clydie Braun, NP   5 months ago Suspected COVID-19 virus infection  [facility] [place], Megan P, DO   6 months ago Acute non-recurrent maxillary sinusitis   [facility] [place], [place], DO   7 months ago Suspected COVID-19 virus infection   [facility] [place], [place], DO   1 year ago Essential hypertension   [facility] Particia N[place], [place]       Future Appointments             In 6 months Larae Grooms, NP [facility], [facility]

## 2020-08-23 NOTE — Telephone Encounter (Signed)
Pt wants Rx for Celebrex and Zetia sent to pharmacy listed below:    ezetimibe (ZETIA) 10 MG tablet   celecoxib (CELEBREX) 100 MG capsule  [facility] [address] [facility] [place], [place] - [address] AT [address]  [address] [place] [address]  Phone: [phone] Fax: [phone]

## 2020-08-25 ENCOUNTER — Other Ambulatory Visit: Payer: Self-pay | Admitting: Nurse Practitioner

## 2020-08-25 NOTE — Telephone Encounter (Signed)
PA would not go through on Cover My Meds.   Called insurance. Medication is approved but is a tier 4. Completed a tier exemption and got tier reduced down to a 1. Will call patient and let her know.

## 2020-08-25 NOTE — Telephone Encounter (Signed)
Patient notified

## 2020-09-21 DIAGNOSIS — Z1231 Encounter for screening mammogram for malignant neoplasm of breast: Secondary | ICD-10-CM

## 2020-09-21 DIAGNOSIS — Z780 Asymptomatic menopausal state: Secondary | ICD-10-CM

## 2020-10-18 ENCOUNTER — Ambulatory Visit (INDEPENDENT_AMBULATORY_CARE_PROVIDER_SITE_OTHER): Payer: Medicare Other | Admitting: Nurse Practitioner

## 2020-10-18 ENCOUNTER — Other Ambulatory Visit: Payer: Self-pay

## 2020-10-18 ENCOUNTER — Encounter: Payer: Self-pay | Admitting: Nurse Practitioner

## 2020-10-18 VITALS — BP 128/78 | HR 81 | Temp 98.4°F | Ht <= 58 in | Wt 181.0 lb

## 2020-10-18 DIAGNOSIS — E782 Mixed hyperlipidemia: Secondary | ICD-10-CM

## 2020-10-18 DIAGNOSIS — Z01818 Encounter for other preprocedural examination: Secondary | ICD-10-CM | POA: Diagnosis not present

## 2020-10-18 DIAGNOSIS — F419 Anxiety disorder, unspecified: Secondary | ICD-10-CM | POA: Diagnosis not present

## 2020-10-18 DIAGNOSIS — R8290 Unspecified abnormal findings in urine: Secondary | ICD-10-CM

## 2020-10-18 DIAGNOSIS — I10 Essential (primary) hypertension: Secondary | ICD-10-CM

## 2020-10-18 DIAGNOSIS — Z6836 Body mass index (BMI) 36.0-36.9, adult: Secondary | ICD-10-CM

## 2020-10-18 LAB — URINALYSIS, ROUTINE W REFLEX MICROSCOPIC
Bilirubin, UA: NEGATIVE
Glucose, UA: NEGATIVE
Nitrite, UA: NEGATIVE
Specific Gravity, UA: >1.03 — ABNORMAL HIGH (ref 1.005–1.030)
Urobilinogen, Ur: 0.2 mg/dL (ref 0.2–1.0)
pH, UA: 5 (ref 5.0–7.5)

## 2020-10-18 LAB — MICROSCOPIC EXAMINATION

## 2020-10-18 NOTE — Progress Notes (Signed)
BP 128/78 (BP Location: Left Arm, Patient Position: Sitting)   Pulse 81   Temp 98.4 F (36.9 C)   Ht 4' 9.48" (1.46 m)   Wt 181 lb (82.1 kg)   LMP  (LMP Unknown)   SpO2 98%   BMI 38.52 kg/m    Subjective:    Patient ID: Beth James, female    DOB: 02/26/1956, 64 y.o.   MRN: [id number]  HPI: Beth James is a 65 y.o. female  Chief Complaint  Patient presents with   Pre-op Exam    Having her a left knee replacement on 11/08/20, here for a pre op exam. Patient has no paperwork.   10/19/2020  Assessment:  Preoperative evaluation and clearance show: Patient will repeat LIPID panel due to very high triglycerides and a urine culture was sent for possible UTI.  WILL clear patient for surgery once these are complete. and patient is cleared for diagnosis for surgical procedure.  Other diagnoses affecting surgical risk include: HTN, Anxiety, HLD, and Obesity   Type of Surgery: Intermediate, 1% - 5% cardiac risk (major intraabdominal, intrathoracic, orthopedic, major head & neck, prostatectomy)   Lee's Revised Cardiac Index: 0 Risk class I, very low, 0.4% risk of cardiac complications   Plan:  1. Patient requires endocarditis prophylaxis: no. 2. GENERAL PREOP INSTRUCTIONS: Proceed with surgery as planned. No food or liquids the morning of surgery. Call surgeon if develops respiratory illness, fever, or other illness. 3. Medications to Hold: NSAIDS for 7 days before surgery, unless instructed otherwise by surgeon. 4. EKG:  07/14/2020 : No acute ST changes noted. 5. Ordered Labs: CMP, CBC w/ Diff, UA, Lipid   From a medical standpoint the patient is an acceptable candidates to undergo general anesthesia for surgery. It is recommended to correct electrolytes and to keep the patient euvolemic and avoid significant fluid shifts during surgery.  Labs reviewed and pt is cleared for surgery. Pt denies a hx of adverse reactions to anesthesia.    Beth James is a 65 y.o.  female who presents to the office today for a preoperative consultation at the request of Dr. Harlow James, who will perform a  L total knee replacement on 11/08/2020.  Current Complaints: L Knee pain  Past Medical History:  Diagnosis Date   Allergy    Anxiety    Depression    GERD (gastroesophageal reflux disease)    Hyperlipidemia    Hypertension    Family History  Problem Relation Age of Onset   Heart disease Mother        CHF   Heart disease Father    Cancer Father        lung   Heart disease Maternal Grandmother        CHF   Cancer Other        colon   Current Outpatient Medications  Medication Sig Dispense Refill   esomeprazole (NEXIUM) 20 MG capsule Take 1 capsule (20 mg total) by mouth daily at 12 noon. 90 capsule 1   fluticasone (FLONASE) 50 MCG/ACT nasal spray Place 2 sprays into both nostrils daily. 16 g 0   Multiple Vitamin (MULTIVITAMIN) tablet Take 1 tablet by mouth daily.     naproxen sodium (ALEVE) 220 MG tablet Take 440 mg by mouth at bedtime.     sertraline (ZOLOFT) 25 MG tablet TAKE 1 TABLET(25 MG) BY MOUTH DAILY 90 tablet 1   celecoxib (CELEBREX) 100 MG capsule Take 1 capsule (100 mg total) by mouth daily. (  Patient not taking: Reported on 10/18/2020) 90 capsule 1   ezetimibe (ZETIA) 10 MG tablet Take 1 tablet (10 mg total) by mouth daily. 90 tablet 3   UNABLE TO FIND Hemp Oil liquid capsules (Patient not taking: Reported on 10/18/2020)     No current facility-administered medications for this visit.   Allergies  Allergen Reactions   Amlodipine Other (See Comments)    jittery   Codeine Other (See Comments)    jittery   Lipitor [Atorvastatin] Other (See Comments)    Myalgias   Lisinopril Other (See Comments)    myalgias   Tape Rash    Needs to use paper tape   Social History   Socioeconomic History   Marital status: Married    Spouse name: Not on file   Number of children: Not on file   Years of education: Not on file   Highest education level: Not  on file  Occupational History   Not on file  Tobacco Use   Smoking status: Never   Smokeless tobacco: Never  Vaping Use   Vaping Use: Never used  Substance and Sexual Activity   Alcohol use: No    Alcohol/week: 0.0 standard drinks   Drug use: No   Sexual activity: Yes  Other Topics Concern   Not on file  Social History Narrative   Not on file   Social Determinants of Health   Financial Resource Strain: Not on file  Food Insecurity: Not on file  Transportation Needs: Not on file  Physical Activity: Not on file  Stress: Not on file  Social Connections: Not on file  Intimate Partner Violence: Not on file     Preoperative Risk Factors  1. Major predictors that require intensive management and may lead to delay in or cancellation of the operative procedure unless emergent:  No Unstable coronary syndromes including unstable or severe angina or recent MI  No Decompensated heart failure including NYHA functional class 4 or worsening or new onset HF  No Significant arrhythmia including high grade AV block, symptomatic ventricular arrhythmias, supraventricular arrhythmias with a ventricular rate >100 bpm at rest, symptomatic bradycardia, and newly recognized ventricular tachycardia  No Severe heart valve disease including severe aortic stenosis or symptomatic mitral stenosis  2. Additional independent predictors of major cardiac complications:   No Hgh risk type of surgery (Vascular surgery and any open intraperitoneal or intrathoracic procedures)  Other clinical predictors that warrant careful assessment of current status  No History of ischemic heart disease No History of CVA No History of compensated heart failure or prior heart failure No Diabetes mellitus on Insulin No Renal insufficiency  3. Perioperative cardiac and long term risk is increased in pts unable to meet a 4-MET demand during most normal daily activities:  Yes Ability to climb 2 flights of stairs or  walk four blocks  Objective:  BP 128/78 (BP Location: Left Arm, Patient Position: Sitting)   Pulse 81   Temp 98.4 F (36.9 C)   Ht 4' 9.48" (1.46 m)   Wt 181 lb (82.1 kg)   LMP  (LMP Unknown)   SpO2 98%   BMI 38.52 kg/m   Body mass index is 38.52 kg/m.     Beth James 10/19/20  Relevant past medical, surgical, family and social history reviewed and updated as indicated. Interim medical history since our last visit reviewed. Allergies and medications reviewed and updated.  Review of Systems  Musculoskeletal:        Left knee pain  Per HPI unless specifically indicated above     Objective:    BP 128/78 (BP Location: Left Arm, Patient Position: Sitting)   Pulse 81   Temp 98.4 F (36.9 C)   Ht 4' 9.48" (1.46 m)   Wt 181 lb (82.1 kg)   LMP  (LMP Unknown)   SpO2 98%   BMI 38.52 kg/m   Wt Readings from Last 3 Encounters:  10/18/20 181 lb (82.1 kg)  08/16/20 180 lb (81.6 kg)  07/14/20 185 lb 6 oz (84.1 kg)    Physical Exam Vitals and nursing note reviewed.  Constitutional:      General: She is awake. She is not in acute distress.    Appearance: She is well-developed. She is obese. She is not ill-appearing.  HENT:     Head: Normocephalic and atraumatic.     Right Ear: Hearing, tympanic membrane, ear canal and external ear normal. No drainage.     Left Ear: Hearing, tympanic membrane, ear canal and external ear normal. No drainage.     Nose: Nose normal.     Right Sinus: No maxillary sinus tenderness or frontal sinus tenderness.     Left Sinus: No maxillary sinus tenderness or frontal sinus tenderness.     Mouth/Throat:     Mouth: Mucous membranes are moist.     Pharynx: Oropharynx is clear. Uvula midline. No pharyngeal swelling, oropharyngeal exudate or posterior oropharyngeal erythema.  Eyes:     General: Lids are normal.        Right eye: No discharge.        Left eye: No discharge.     Extraocular Movements: Extraocular movements intact.      Conjunctiva/sclera: Conjunctivae normal.     Pupils: Pupils are equal, round, and reactive to light.     Visual Fields: Right eye visual fields normal and left eye visual fields normal.  Neck:     Thyroid: No thyromegaly.     Vascular: No carotid bruit.     Trachea: Trachea normal.  Cardiovascular:     Rate and Rhythm: Normal rate and regular rhythm.     Heart sounds: Normal heart sounds. No murmur heard.   No gallop.  Pulmonary:     Effort: Pulmonary effort is normal. No accessory muscle usage or respiratory distress.     Breath sounds: Normal breath sounds.  Chest:  Breasts:    Right: Normal.     Left: Normal.  Abdominal:     General: Bowel sounds are normal.     Palpations: Abdomen is soft. There is no hepatomegaly or splenomegaly.     Tenderness: There is no abdominal tenderness.  Musculoskeletal:        General: Normal range of motion.     Cervical back: Normal range of motion and neck supple.     Right lower leg: No edema.     Left lower leg: No edema.  Lymphadenopathy:     Head:     Right side of head: No submental, submandibular, tonsillar, preauricular or posterior auricular adenopathy.     Left side of head: No submental, submandibular, tonsillar, preauricular or posterior auricular adenopathy.     Cervical: No cervical adenopathy.     Upper Body:     Right upper body: No supraclavicular, axillary or pectoral adenopathy.     Left upper body: No supraclavicular, axillary or pectoral adenopathy.  Skin:    General: Skin is warm and dry.     Capillary Refill: Capillary refill takes  less than 2 seconds.     Findings: No rash.  Neurological:     Mental Status: She is alert and oriented to person, place, and time.     Cranial Nerves: Cranial nerves are intact.     Gait: Gait is intact.     Deep Tendon Reflexes: Reflexes are normal and symmetric.     Reflex Scores:      Brachioradialis reflexes are 2+ on the right side and 2+ on the left side.      Patellar reflexes  are 2+ on the right side and 2+ on the left side. Psychiatric:        Attention and Perception: Attention normal.        Mood and Affect: Mood normal.        Speech: Speech normal.        Behavior: Behavior normal. Behavior is cooperative.        Thought Content: Thought content normal.        Judgment: Judgment normal.    Results for orders placed or performed in visit on 10/18/20  Microscopic Examination   Urine  Result Value Ref Range   WBC, UA 6-10 (A) 0 - 5 /hpf   RBC 0-2 0 - 2 /hpf   Epithelial Cells (non renal) 0-10 0 - 10 /hpf   Mucus, UA Present (A) Not Estab.   Bacteria, UA Few (A) None seen/Few  Comp Met (CMET)  Result Value Ref Range   Glucose 91 65 - 99 mg/dL   BUN 20 8 - 27 mg/dL   Creatinine, Ser 0.60 0.57 - 1.00 mg/dL   eGFR 100 >59 mL/min/1.73   BUN/Creatinine Ratio 33 (H) 12 - 28   Sodium 141 134 - 144 mmol/L   Potassium 4.4 3.5 - 5.2 mmol/L   Chloride 99 96 - 106 mmol/L   CO2 24 20 - 29 mmol/L   Calcium 9.7 8.7 - 10.3 mg/dL   Total Protein 6.7 6.0 - 8.5 g/dL   Albumin 4.4 3.8 - 4.8 g/dL   Globulin, Total 2.3 1.5 - 4.5 g/dL   Albumin/Globulin Ratio 1.9 1.2 - 2.2   Bilirubin Total <0.2 0.0 - 1.2 mg/dL   Alkaline Phosphatase 100 44 - 121 IU/L   AST 14 0 - 40 IU/L   ALT 12 0 - 32 IU/L  Lipid Profile  Result Value Ref Range   Cholesterol, Total 210 (H) 100 - 199 mg/dL   Triglycerides 579 (HH) 0 - 149 mg/dL   HDL 39 (L) >39 mg/dL   VLDL Cholesterol Cal 92 (H) 5 - 40 mg/dL   LDL Chol Calc (NIH) 79 0 - 99 mg/dL   Chol/HDL Ratio 5.4 (H) 0.0 - 4.4 ratio  CBC w/Diff  Result Value Ref Range   WBC 9.9 3.4 - 10.8 x10E3/uL   RBC 4.82 3.77 - 5.28 x10E6/uL   Hemoglobin 13.8 11.1 - 15.9 g/dL   Hematocrit 41.2 34.0 - 46.6 %   MCV 86 79 - 97 fL   MCH 28.6 26.6 - 33.0 pg   MCHC 33.5 31.5 - 35.7 g/dL   RDW 13.1 11.7 - 15.4 %   Platelets 416 150 - 450 x10E3/uL   Neutrophils 59 Not Estab. %   Lymphs 31 Not Estab. %   Monocytes 8 Not Estab. %   Eos 1 Not Estab.  %   Basos 1 Not Estab. %   Neutrophils Absolute 5.8 1.4 - 7.0 x10E3/uL   Lymphocytes Absolute 3.0 0.7 - 3.1 x10E3/uL  Monocytes Absolute 0.8 0.1 - 0.9 x10E3/uL   EOS (ABSOLUTE) 0.1 0.0 - 0.4 x10E3/uL   Basophils Absolute 0.1 0.0 - 0.2 x10E3/uL   Immature Granulocytes 0 Not Estab. %   Immature Grans (Abs) 0.0 0.0 - 0.1 x10E3/uL  Urinalysis, Routine w reflex microscopic  Result Value Ref Range   Specific Gravity, UA >1.030 (H) 1.005 - 1.030   pH, UA 5.0 5.0 - 7.5   Color, UA Yellow Yellow   Appearance Ur Clear Clear   Leukocytes,UA Trace (A) Negative   Protein,UA 1+ (A) Negative/Trace   Glucose, UA Negative Negative   Ketones, UA Trace (A) Negative   RBC, UA Trace (A) Negative   Bilirubin, UA Negative Negative   Urobilinogen, Ur 0.2 0.2 - 1.0 mg/dL   Nitrite, UA Negative Negative   Microscopic Examination See below:       Assessment & Plan:   Problem List Items Addressed This Visit       Cardiovascular and Mediastinum   Essential hypertension    Chronic.  Controlled.  Continue with current medication regimen.  Labs ordered today.  Return to clinic in 6 months for reevaluation.  Call sooner if concerns arise.          Other   Obesity   Anxiety   Hyperlipidemia    Controlled on Zetia. Will have patient repeat Lipid panel due to elevated Triglycerides. Will update note once it is complete.      Relevant Orders   Lipid Profile (Completed)   Lipid Profile   Other Visit Diagnoses     Pre-op exam    -  Primary   Patient to have L TKR on 11/08/20. Will clear for surgery once LIPID panel has been repeated and abnormal UA has been addressed.    Relevant Orders   Comp Met (CMET) (Completed)   Lipid Profile (Completed)   CBC w/Diff (Completed)   Urinalysis, Routine w reflex microscopic (Completed)   Abnormal urinalysis       Urine sent for culture.  Will treat for UTI if necessary.    Relevant Orders   Urine Culture        Follow up plan: Return if symptoms worsen  or fail to improve.

## 2020-10-19 ENCOUNTER — Other Ambulatory Visit: Payer: Self-pay

## 2020-10-19 LAB — LIPID PANEL
Chol/HDL Ratio: 5.4 ratio — ABNORMAL HIGH (ref 0.0–4.4)
Cholesterol, Total: 210 mg/dL — ABNORMAL HIGH (ref 100–199)
HDL: 39 mg/dL — ABNORMAL LOW (ref >39)
LDL Chol Calc (NIH): 79 mg/dL (ref 0–99)
Triglycerides: 579 mg/dL (ref 0–149)
VLDL Cholesterol Cal: 92 mg/dL — ABNORMAL HIGH (ref 5–40)

## 2020-10-19 LAB — CBC WITH DIFFERENTIAL/PLATELET
Basophils Absolute: 0.1 10*3/uL (ref 0.0–0.2)
Basos: 1 %
EOS (ABSOLUTE): 0.1 10*3/uL (ref 0.0–0.4)
Eos: 1 %
Hematocrit: 41.2 % (ref 34.0–46.6)
Hemoglobin: 13.8 g/dL (ref 11.1–15.9)
Immature Grans (Abs): 0 10*3/uL (ref 0.0–0.1)
Immature Granulocytes: 0 %
Lymphocytes Absolute: 3 10*3/uL (ref 0.7–3.1)
Lymphs: 31 %
MCH: 28.6 pg (ref 26.6–33.0)
MCHC: 33.5 g/dL (ref 31.5–35.7)
MCV: 86 fL (ref 79–97)
Monocytes Absolute: 0.8 10*3/uL (ref 0.1–0.9)
Monocytes: 8 %
Neutrophils Absolute: 5.8 10*3/uL (ref 1.4–7.0)
Neutrophils: 59 %
Platelets: 416 10*3/uL (ref 150–450)
RBC: 4.82 x10E6/uL (ref 3.77–5.28)
RDW: 13.1 % (ref 11.7–15.4)
WBC: 9.9 10*3/uL (ref 3.4–10.8)

## 2020-10-19 LAB — COMPREHENSIVE METABOLIC PANEL
ALT: 12 IU/L (ref 0–32)
AST: 14 IU/L (ref 0–40)
Albumin/Globulin Ratio: 1.9 (ref 1.2–2.2)
Albumin: 4.4 g/dL (ref 3.8–4.8)
Alkaline Phosphatase: 100 IU/L (ref 44–121)
BUN/Creatinine Ratio: 33 — ABNORMAL HIGH (ref 12–28)
BUN: 20 mg/dL (ref 8–27)
Bilirubin Total: <0.2 mg/dL (ref 0.0–1.2)
CO2: 24 mmol/L (ref 20–29)
Calcium: 9.7 mg/dL (ref 8.7–10.3)
Chloride: 99 mmol/L (ref 96–106)
Creatinine, Ser: 0.6 mg/dL (ref 0.57–1.00)
Globulin, Total: 2.3 g/dL (ref 1.5–4.5)
Glucose: 91 mg/dL (ref 65–99)
Potassium: 4.4 mmol/L (ref 3.5–5.2)
Sodium: 141 mmol/L (ref 134–144)
Total Protein: 6.7 g/dL (ref 6.0–8.5)
eGFR: 100 mL/min/{1.73_m2} (ref >59)

## 2020-10-19 MED ORDER — EZETIMIBE 10 MG PO TABS: 10.0000 mg | ORAL_TABLET | Freq: Every day | ORAL | 3 refills | Status: DC

## 2020-10-19 NOTE — Progress Notes (Signed)
Please let patient know that her blood work shows her triglycerides are significantly elevated.  This is likely because she was not fasting.  I need her to come back and repeat them fasting in order for her surgeon not to delay her surgery.  Also, her urine had a good amount of bacteria in it. I have sent it for culture and will treat her for a UTI if necessary.  Please let me know if she has any questions. I have placed the repeat lab order.

## 2020-10-19 NOTE — Assessment & Plan Note (Signed)
Controlled on Zetia. Will have patient repeat Lipid panel due to elevated Triglycerides. Will update note once it is complete.

## 2020-10-19 NOTE — Assessment & Plan Note (Signed)
Chronic.  Controlled.  Continue with current medication regimen.  Labs ordered today.  Return to clinic in 6 months for reevaluation.  Call sooner if concerns arise.  ? ?

## 2020-10-20 ENCOUNTER — Other Ambulatory Visit: Payer: Self-pay

## 2020-10-20 ENCOUNTER — Other Ambulatory Visit: Payer: Medicare Other

## 2020-10-20 DIAGNOSIS — E782 Mixed hyperlipidemia: Secondary | ICD-10-CM

## 2020-10-20 LAB — URINE CULTURE

## 2020-10-20 NOTE — Progress Notes (Signed)
Hi Pam.  No evidence of a UTI on your urine culture.  No need for antibiotics.  I will update your pre op.  Have a great weekend.

## 2020-10-21 LAB — LIPID PANEL
Chol/HDL Ratio: 5 ratio — ABNORMAL HIGH (ref 0.0–4.4)
Cholesterol, Total: 189 mg/dL (ref 100–199)
HDL: 38 mg/dL — ABNORMAL LOW (ref >39)
LDL Chol Calc (NIH): 102 mg/dL — ABNORMAL HIGH (ref 0–99)
Triglycerides: 288 mg/dL — ABNORMAL HIGH (ref 0–149)
VLDL Cholesterol Cal: 49 mg/dL — ABNORMAL HIGH (ref 5–40)

## 2020-10-23 NOTE — Progress Notes (Signed)
Hi Beth James. Your Triglycerides have improved from your previous visit and I will update the pre op and clear you for surgery.  However, they are still elevated.  Recommend decreasing processed foods and refined sugar intake.  Please let me know if you have any questions.

## 2020-10-31 DIAGNOSIS — M1712 Unilateral primary osteoarthritis, left knee: Secondary | ICD-10-CM | POA: Insufficient documentation

## 2020-11-09 DIAGNOSIS — Z96659 Presence of unspecified artificial knee joint: Secondary | ICD-10-CM | POA: Insufficient documentation

## 2021-01-09 ENCOUNTER — Other Ambulatory Visit: Payer: Self-pay | Admitting: Nurse Practitioner

## 2021-01-09 MED ORDER — SERTRALINE HCL 25 MG PO TABS: ORAL_TABLET | ORAL | 1 refills | Status: DC

## 2021-01-09 NOTE — Telephone Encounter (Signed)
Duplicate request Requested Prescriptions  Pending Prescriptions Disp Refills  . 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

## 2021-02-28 ENCOUNTER — Ambulatory Visit: Payer: Medicare Other | Admitting: Nurse Practitioner

## 2021-03-07 ENCOUNTER — Ambulatory Visit: Payer: Medicare Other | Admitting: Nurse Practitioner

## 2021-03-13 ENCOUNTER — Other Ambulatory Visit: Payer: Self-pay

## 2021-03-13 ENCOUNTER — Encounter: Payer: Self-pay | Admitting: Nurse Practitioner

## 2021-03-13 ENCOUNTER — Ambulatory Visit (INDEPENDENT_AMBULATORY_CARE_PROVIDER_SITE_OTHER): Payer: Medicare Other | Admitting: Nurse Practitioner

## 2021-03-13 VITALS — BP 140/78 | HR 79 | Temp 97.6°F | Wt 181.6 lb

## 2021-03-13 DIAGNOSIS — E782 Mixed hyperlipidemia: Secondary | ICD-10-CM

## 2021-03-13 DIAGNOSIS — I10 Essential (primary) hypertension: Secondary | ICD-10-CM | POA: Diagnosis not present

## 2021-03-13 DIAGNOSIS — Z1231 Encounter for screening mammogram for malignant neoplasm of breast: Secondary | ICD-10-CM | POA: Diagnosis not present

## 2021-03-13 DIAGNOSIS — F419 Anxiety disorder, unspecified: Secondary | ICD-10-CM | POA: Diagnosis not present

## 2021-03-13 MED ORDER — ESOMEPRAZOLE MAGNESIUM 20 MG PO CPDR: 20.0000 mg | DELAYED_RELEASE_CAPSULE | Freq: Every day | ORAL | 1 refills | Status: DC

## 2021-03-13 NOTE — Assessment & Plan Note (Signed)
Chronic.  Not well controlled. Patient does not want to go on medication.  She does check blood pressures at home and they are running 140/90s.  Advised patient that NSAIDS increase blood pressure.  Will continue to reassess medication readiness at future visits. Follow up in 6 months. Call sooner if concerns arise.

## 2021-03-13 NOTE — Progress Notes (Signed)
BP 140/78    Pulse 79    Temp 97.6 F (36.4 C)    Wt 181 lb 9.6 oz (82.4 kg)    LMP  (LMP Unknown)    SpO2 98%    BMI 38.64 kg/m    Subjective:    Patient ID: Beth James, female    DOB: 1956-02-24, 66 y.o.   MRN: [id number]  HPI: Beth James is a 66 y.o. female  Chief Complaint  Patient presents with   Hypertension   Hyperlipidemia   Anxiety   Obesity   HYPERTENSION Hypertension status: controlled  Satisfied with current treatment? yes Duration of hypertension: years BP monitoring frequency:  daily BP range: 140/80- she is taking celebrex 148m- after she completes this bottle she plans to stop taking it BP medication side effects:  no Medication compliance: excellent compliance Previous BP meds: Aspirin: no Recurrent headaches: no Visual changes: no Palpitations: no Dyspnea: no Chest pain: no Lower extremity edema: no Dizzy/lightheaded: no  HYPERLIPIDEMIA Patient started Zetia after last visit.  Patient states she is tolerating well.  Does not see any side effects with the medication.  Relevant past medical, surgical, family and social history reviewed and updated as indicated. Interim medical history since our last visit reviewed. Allergies and medications reviewed and updated.  Review of Systems  Eyes:  Negative for visual disturbance.  Respiratory:  Negative for cough, chest tightness and shortness of breath.   Cardiovascular:  Negative for chest pain, palpitations and leg swelling.  Neurological:  Negative for dizziness and headaches.  Psychiatric/Behavioral:  Negative for dysphoric mood. The patient is not nervous/anxious.    Per HPI unless specifically indicated above     Objective:    BP 140/78    Pulse 79    Temp 97.6 F (36.4 C)    Wt 181 lb 9.6 oz (82.4 kg)    LMP  (LMP Unknown)    SpO2 98%    BMI 38.64 kg/m   Wt Readings from Last 3 Encounters:  03/13/21 181 lb 9.6 oz (82.4 kg)  10/18/20 181 lb (82.1 kg)  08/16/20 180 lb (81.6 kg)     Physical Exam Vitals and nursing note reviewed.  Constitutional:      General: She is not in acute distress.    Appearance: Normal appearance. She is obese. She is not ill-appearing, toxic-appearing or diaphoretic.  HENT:     Head: Normocephalic.     Right Ear: External ear normal.     Left Ear: External ear normal.     Nose: Nose normal.     Mouth/Throat:     Mouth: Mucous membranes are moist.     Pharynx: Oropharynx is clear.  Eyes:     General:        Right eye: No discharge.        Left eye: No discharge.     Extraocular Movements: Extraocular movements intact.     Conjunctiva/sclera: Conjunctivae normal.     Pupils: Pupils are equal, round, and reactive to light.  Cardiovascular:     Rate and Rhythm: Normal rate and regular rhythm.     Heart sounds: No murmur heard. Pulmonary:     Effort: Pulmonary effort is normal. No respiratory distress.     Breath sounds: Normal breath sounds. No wheezing or rales.  Musculoskeletal:     Cervical back: Normal range of motion and neck supple.  Skin:    General: Skin is warm and dry.  Capillary Refill: Capillary refill takes less than 2 seconds.  Neurological:     General: No focal deficit present.     Mental Status: She is alert and oriented to person, place, and time. Mental status is at baseline.  Psychiatric:        Mood and Affect: Mood normal.        Behavior: Behavior normal.        Thought Content: Thought content normal.        Judgment: Judgment normal.    Results for orders placed or performed in visit on 10/20/20  Lipid Profile  Result Value Ref Range   Cholesterol, Total 189 100 - 199 mg/dL   Triglycerides 288 (H) 0 - 149 mg/dL   HDL 38 (L) >39 mg/dL   VLDL Cholesterol Cal 49 (H) 5 - 40 mg/dL   LDL Chol Calc (NIH) 102 (H) 0 - 99 mg/dL   Chol/HDL Ratio 5.0 (H) 0.0 - 4.4 ratio      Assessment & Plan:   Problem List Items Addressed This Visit       Cardiovascular and Mediastinum   Essential  hypertension - Primary    Chronic.  Not well controlled. Patient does not want to go on medication.  She does check blood pressures at home and they are running 140/90s.  Advised patient that NSAIDS increase blood pressure.  Will continue to reassess medication readiness at future visits. Follow up in 6 months. Call sooner if concerns arise.        Relevant Orders   Comp Met (CMET)     Other   Anxiety    Chronic.  Controlled.  Continue with current medication regimen on Zoloft 63m daily..  Labs ordered today.  Return to clinic in 6 months for reevaluation.  Call sooner if concerns arise.        Hyperlipidemia    Chronic.  Controlled.  Continue with current medication regimen on Zetia 134m  Labs ordered today.  Return to clinic in 6 months for reevaluation.  Call sooner if concerns arise.        Relevant Orders   Lipid Profile   Other Visit Diagnoses     Encounter for screening mammogram for malignant neoplasm of breast       Relevant Orders   MM Digital Screening        Follow up plan: Return in about 6 months (around 09/10/2021) for HTN, HLD, DM2 FU and PAP.

## 2021-03-13 NOTE — Assessment & Plan Note (Signed)
Chronic.  Controlled.  Continue with current medication regimen on Zetia 10mg.  Labs ordered today.  Return to clinic in 6 months for reevaluation.  Call sooner if concerns arise.   

## 2021-03-13 NOTE — Patient Instructions (Signed)
Please call to schedule your mammogram and/or bone density: °[facility]  °Address: [address], [place], [place] [address]  °Phone: [phone] ° °

## 2021-03-13 NOTE — Assessment & Plan Note (Signed)
Chronic.  Controlled.  Continue with current medication regimen on Zoloft 25mg  daily..  Labs ordered today.  Return to clinic in 6 months for reevaluation.  Call sooner if concerns arise.

## 2021-03-14 LAB — COMPREHENSIVE METABOLIC PANEL
ALT: 10 IU/L (ref 0–32)
AST: 12 IU/L (ref 0–40)
Albumin/Globulin Ratio: 1.6 (ref 1.2–2.2)
Albumin: 4.1 g/dL (ref 3.8–4.8)
Alkaline Phosphatase: 105 IU/L (ref 44–121)
BUN/Creatinine Ratio: 29 — ABNORMAL HIGH (ref 12–28)
BUN: 19 mg/dL (ref 8–27)
Bilirubin Total: 0.3 mg/dL (ref 0.0–1.2)
CO2: 24 mmol/L (ref 20–29)
Calcium: 9.4 mg/dL (ref 8.7–10.3)
Chloride: 105 mmol/L (ref 96–106)
Creatinine, Ser: 0.65 mg/dL (ref 0.57–1.00)
Globulin, Total: 2.5 g/dL (ref 1.5–4.5)
Glucose: 96 mg/dL (ref 70–99)
Potassium: 4.8 mmol/L (ref 3.5–5.2)
Sodium: 142 mmol/L (ref 134–144)
Total Protein: 6.6 g/dL (ref 6.0–8.5)
eGFR: 98 mL/min/{1.73_m2} (ref >59)

## 2021-03-14 LAB — LIPID PANEL
Chol/HDL Ratio: 4.3 ratio (ref 0.0–4.4)
Cholesterol, Total: 188 mg/dL (ref 100–199)
HDL: 44 mg/dL (ref >39)
LDL Chol Calc (NIH): 115 mg/dL — ABNORMAL HIGH (ref 0–99)
Triglycerides: 167 mg/dL — ABNORMAL HIGH (ref 0–149)
VLDL Cholesterol Cal: 29 mg/dL (ref 5–40)

## 2021-03-14 NOTE — Progress Notes (Signed)
Hi Beth James. Your blood work looks good.  Your liver, kidneys and electrolytes look good. Her triglycerides have improved from prior.  LDL was slightly up but overall your cholesterol looked better.  Continue with the Zetia and I will see you at our next visit.

## 2021-03-14 NOTE — Progress Notes (Signed)
Hi Pam. Your blood work looks good.  Your liver, kidneys and electrolytes look good. Her triglycerides have improved from prior.  LDL was slightly up but overall your cholesterol looked better.  Continue with the Zetia and I will see you at our next visit.

## 2021-07-04 ENCOUNTER — Other Ambulatory Visit: Payer: Self-pay | Admitting: Nurse Practitioner

## 2021-07-04 NOTE — Telephone Encounter (Signed)
Requested Prescriptions  ?Pending Prescriptions Disp Refills  ?? sertraline (ZOLOFT) 25 MG tablet [Pharmacy Med Name: SERTRALINE HCL 25 MG TABLET] 90 tablet 1  ?  Sig: TAKE 1 TABLET BY MOUTH DAILY  ?  ? Psychiatry:  Antidepressants - SSRI - sertraline Passed - 07/04/2021  2:54 AM  ?  ?  Passed - AST in normal range and within 360 days  ?  AST  ?Date Value Ref Range Status  ?03/13/2021 12 0 - 40 IU/L Final  ?   ?  ?  Passed - ALT in normal range and within 360 days  ?  ALT  ?Date Value Ref Range Status  ?03/13/2021 10 0 - 32 IU/L Final  ?   ?  ?  Passed - Completed PHQ-2 or PHQ-9 in the last 360 days  ?  ?  Passed - Valid encounter within last 6 months  ?  Recent Outpatient Visits   ?      ? 3 months ago Essential hypertension  ? [place], NP  ? 8 months ago Pre-op exam  ? [place], NP  ? 10 months ago Generalized OA  ? [place], NP  ? 1 year ago Suspected COVID-19 virus infection  ? [place], [place], DO  ? 1 year ago Acute non-recurrent maxillary sinusitis  ? [place], [place], DO  ?  ?  ?Future Appointments   ?        ? In 2 months [name], NP [facility]  ?  ? ?  ?  ?  ? ? ?

## 2021-07-06 ENCOUNTER — Encounter: Payer: Self-pay | Admitting: Nurse Practitioner

## 2021-09-07 NOTE — Progress Notes (Unsigned)
LMP  (LMP Unknown)    Subjective:    Patient ID: JADEYN HARGETT, female    DOB: March 09, 1955, 66 y.o.   MRN: [id number]  HPI: NYAIRA HODGENS is a 66 y.o. female  No chief complaint on file.  HYPERTENSION Hypertension status: controlled  Satisfied with current treatment? yes Duration of hypertension: years BP monitoring frequency:  daily BP range: 140/80- she is taking celebrex 184m- after she completes this bottle she plans to stop taking it BP medication side effects:  no Medication compliance: excellent compliance Previous BP meds: Aspirin: no Recurrent headaches: no Visual changes: no Palpitations: no Dyspnea: no Chest pain: no Lower extremity edema: no Dizzy/lightheaded: no  HYPERLIPIDEMIA Patient started Zetia after last visit.  Patient states she is tolerating well.  Does not see any side effects with the medication.  Relevant past medical, surgical, family and social history reviewed and updated as indicated. Interim medical history since our last visit reviewed. Allergies and medications reviewed and updated.  Review of Systems  Eyes:  Negative for visual disturbance.  Respiratory:  Negative for cough, chest tightness and shortness of breath.   Cardiovascular:  Negative for chest pain, palpitations and leg swelling.  Neurological:  Negative for dizziness and headaches.  Psychiatric/Behavioral:  Negative for dysphoric mood. The patient is not nervous/anxious.     Per HPI unless specifically indicated above     Objective:    LMP  (LMP Unknown)   Wt Readings from Last 3 Encounters:  03/13/21 181 lb 9.6 oz (82.4 kg)  10/18/20 181 lb (82.1 kg)  08/16/20 180 lb (81.6 kg)    Physical Exam Vitals and nursing note reviewed.  Constitutional:      General: She is not in acute distress.    Appearance: Normal appearance. She is obese. She is not ill-appearing, toxic-appearing or diaphoretic.  HENT:     Head: Normocephalic.     Right Ear: External ear  normal.     Left Ear: External ear normal.     Nose: Nose normal.     Mouth/Throat:     Mouth: Mucous membranes are moist.     Pharynx: Oropharynx is clear.  Eyes:     General:        Right eye: No discharge.        Left eye: No discharge.     Extraocular Movements: Extraocular movements intact.     Conjunctiva/sclera: Conjunctivae normal.     Pupils: Pupils are equal, round, and reactive to light.  Cardiovascular:     Rate and Rhythm: Normal rate and regular rhythm.     Heart sounds: No murmur heard. Pulmonary:     Effort: Pulmonary effort is normal. No respiratory distress.     Breath sounds: Normal breath sounds. No wheezing or rales.  Musculoskeletal:     Cervical back: Normal range of motion and neck supple.  Skin:    General: Skin is warm and dry.     Capillary Refill: Capillary refill takes less than 2 seconds.  Neurological:     General: No focal deficit present.     Mental Status: She is alert and oriented to person, place, and time. Mental status is at baseline.  Psychiatric:        Mood and Affect: Mood normal.        Behavior: Behavior normal.        Thought Content: Thought content normal.        Judgment: Judgment normal.  Results for orders placed or performed in visit on 03/13/21  Comp Met (CMET)  Result Value Ref Range   Glucose 96 70 - 99 mg/dL   BUN 19 8 - 27 mg/dL   Creatinine, Ser 0.65 0.57 - 1.00 mg/dL   eGFR 98 >59 mL/min/1.73   BUN/Creatinine Ratio 29 (H) 12 - 28   Sodium 142 134 - 144 mmol/L   Potassium 4.8 3.5 - 5.2 mmol/L   Chloride 105 96 - 106 mmol/L   CO2 24 20 - 29 mmol/L   Calcium 9.4 8.7 - 10.3 mg/dL   Total Protein 6.6 6.0 - 8.5 g/dL   Albumin 4.1 3.8 - 4.8 g/dL   Globulin, Total 2.5 1.5 - 4.5 g/dL   Albumin/Globulin Ratio 1.6 1.2 - 2.2   Bilirubin Total 0.3 0.0 - 1.2 mg/dL   Alkaline Phosphatase 105 44 - 121 IU/L   AST 12 0 - 40 IU/L   ALT 10 0 - 32 IU/L  Lipid Profile  Result Value Ref Range   Cholesterol, Total 188 100  - 199 mg/dL   Triglycerides 167 (H) 0 - 149 mg/dL   HDL 44 >39 mg/dL   VLDL Cholesterol Cal 29 5 - 40 mg/dL   LDL Chol Calc (NIH) 115 (H) 0 - 99 mg/dL   Chol/HDL Ratio 4.3 0.0 - 4.4 ratio      Assessment & Plan:   Problem List Items Addressed This Visit       Cardiovascular and Mediastinum   Essential hypertension - Primary     Other   Anxiety   Hyperlipidemia     Follow up plan: No follow-ups on file.

## 2021-09-10 ENCOUNTER — Ambulatory Visit (INDEPENDENT_AMBULATORY_CARE_PROVIDER_SITE_OTHER): Payer: Medicare Other | Admitting: Nurse Practitioner

## 2021-09-10 ENCOUNTER — Encounter: Payer: Self-pay | Admitting: Nurse Practitioner

## 2021-09-10 VITALS — BP 147/83 | HR 72 | Temp 98.2°F | Wt 192.6 lb

## 2021-09-10 DIAGNOSIS — F419 Anxiety disorder, unspecified: Secondary | ICD-10-CM | POA: Diagnosis not present

## 2021-09-10 DIAGNOSIS — I10 Essential (primary) hypertension: Secondary | ICD-10-CM | POA: Diagnosis not present

## 2021-09-10 DIAGNOSIS — E782 Mixed hyperlipidemia: Secondary | ICD-10-CM | POA: Diagnosis not present

## 2021-09-10 MED ORDER — SERTRALINE HCL 25 MG PO TABS: ORAL_TABLET | ORAL | 1 refills | Status: DC

## 2021-09-10 MED ORDER — EZETIMIBE 10 MG PO TABS: 10.0000 mg | ORAL_TABLET | Freq: Every day | ORAL | 1 refills | Status: DC

## 2021-09-10 MED ORDER — LOSARTAN POTASSIUM 25 MG PO TABS: 25.0000 mg | ORAL_TABLET | Freq: Every day | ORAL | 0 refills | Status: DC

## 2021-09-10 NOTE — Assessment & Plan Note (Signed)
Chronic. Not well controlled.  Has not tolerated Amlodipine or Lisinopril. Will start Losartan 25mg  daily.  Side effects and benefits discussed at visit today.  Labs ordered.  Follow up in 6 months for reevaluation.  Call sooner if concerns arise.

## 2021-09-10 NOTE — Assessment & Plan Note (Signed)
Chronic.  Controlled.  Continue with current medication regimen of Zoloft 25mg daily.  Refills sent today.  Labs ordered today.  Return to clinic in 6 months for reevaluation.  Call sooner if concerns arise.   

## 2021-09-10 NOTE — Assessment & Plan Note (Signed)
Chronic.  Controlled.  Continue with current medication regimen of Zetia 10mg. Refills sent today.  Labs ordered today.  Return to clinic in 6 months for reevaluation.  Call sooner if concerns arise.   

## 2021-09-11 LAB — COMPREHENSIVE METABOLIC PANEL
ALT: 8 IU/L (ref 0–32)
AST: 12 IU/L (ref 0–40)
Albumin/Globulin Ratio: 1.8 (ref 1.2–2.2)
Albumin: 4.2 g/dL (ref 3.9–4.9)
Alkaline Phosphatase: 100 IU/L (ref 44–121)
BUN/Creatinine Ratio: 21 (ref 12–28)
BUN: 14 mg/dL (ref 8–27)
Bilirubin Total: 0.3 mg/dL (ref 0.0–1.2)
CO2: 25 mmol/L (ref 20–29)
Calcium: 9.6 mg/dL (ref 8.7–10.3)
Chloride: 101 mmol/L (ref 96–106)
Creatinine, Ser: 0.66 mg/dL (ref 0.57–1.00)
Globulin, Total: 2.3 g/dL (ref 1.5–4.5)
Glucose: 94 mg/dL (ref 70–99)
Potassium: 4.4 mmol/L (ref 3.5–5.2)
Sodium: 139 mmol/L (ref 134–144)
Total Protein: 6.5 g/dL (ref 6.0–8.5)
eGFR: 97 mL/min/{1.73_m2} (ref >59)

## 2021-09-11 LAB — LIPID PANEL
Chol/HDL Ratio: 5.1 ratio — ABNORMAL HIGH (ref 0.0–4.4)
Cholesterol, Total: 215 mg/dL — ABNORMAL HIGH (ref 100–199)
HDL: 42 mg/dL (ref >39)
LDL Chol Calc (NIH): 126 mg/dL — ABNORMAL HIGH (ref 0–99)
Triglycerides: 265 mg/dL — ABNORMAL HIGH (ref 0–149)
VLDL Cholesterol Cal: 47 mg/dL — ABNORMAL HIGH (ref 5–40)

## 2021-09-11 NOTE — Progress Notes (Signed)
Hi Beth James. It was nice to see you yesterday.  Your lab work shows that your cholesterol is elevated from prior.  I recommend decreasing processed foods, refined sugar intake and following a low fat diet.  No other concerns at this time.  Follow up as discussed.

## 2021-11-05 ENCOUNTER — Ambulatory Visit (INDEPENDENT_AMBULATORY_CARE_PROVIDER_SITE_OTHER): Payer: Medicare Other

## 2021-11-05 DIAGNOSIS — Z0000 Encounter for general adult medical examination without abnormal findings: Secondary | ICD-10-CM | POA: Diagnosis not present

## 2021-11-05 NOTE — Progress Notes (Signed)
I connected with  Beth James on 11/05/21 by a audio enabled telemedicine application and verified that I am speaking with the correct person using two identifiers.  Patient Location: Home  Provider Location: Office/Clinic  I discussed the limitations of evaluation and management by telemedicine. The patient expressed understanding and agreed to proceed.    .  Subjective:   Beth James is a 66 y.o. female who presents for an Initial Medicare Annual Wellness Visit.  Review of Systems    Defer to PCP.        Objective:    There were no vitals filed for this visit. There is no height or weight on file to calculate BMI.      No data to display           Current Medications (verified) Outpatient Encounter Medications as of 11/05/2021  Medication Sig   esomeprazole (NEXIUM) 20 MG capsule Take 1 capsule (20 mg total) by mouth daily at 12 noon.   ezetimibe (ZETIA) 10 MG tablet Take 1 tablet (10 mg total) by mouth daily.   Multiple Vitamin (MULTIVITAMIN) tablet Take 1 tablet by mouth daily.   sertraline (ZOLOFT) 25 MG tablet TAKE 1 TABLET BY MOUTH DAILY   losartan (COZAAR) 25 MG tablet Take 1 tablet (25 mg total) by mouth daily. (Patient not taking: Reported on 11/05/2021)   No facility-administered encounter medications on file as of 11/05/2021.    Allergies (verified) Amlodipine, Codeine, Lipitor [atorvastatin], Lisinopril, and Tape   History: Past Medical History:  Diagnosis Date   Allergy    Anxiety    Depression    GERD (gastroesophageal reflux disease)    Hyperlipidemia    Hypertension    Past Surgical History:  Procedure Laterality Date   bone spur removal     left shoulder   cartilage removal     left knee   Clogged Tear Duct Stint placed     Family History  Problem Relation Age of Onset   Heart disease Mother        CHF   Heart disease Father    Cancer Father        lung   Heart disease Maternal Grandmother        CHF   Cancer Other         colon   Social History   Socioeconomic History   Marital status: Married    Spouse name: Not on file   Number of children: Not on file   Years of education: Not on file   Highest education level: Not on file  Occupational History   Not on file  Tobacco Use   Smoking status: Never   Smokeless tobacco: Never  Vaping Use   Vaping Use: Never used  Substance and Sexual Activity   Alcohol use: No    Alcohol/week: 0.0 standard drinks of alcohol   Drug use: No   Sexual activity: Yes  Other Topics Concern   Not on file  Social History Narrative   Not on file   Social Determinants of Health   Financial Resource Strain: Low Risk  (11/05/2021)   Overall Financial Resource Strain (CARDIA)    Difficulty of Paying Living Expenses: Not hard at all  Food Insecurity: No Food Insecurity (11/05/2021)   Hunger Vital Sign    Worried About Running Out of Food in the Last Year: Never true    Ran Out of Food in the Last Year: Never true  Transportation Needs: No Transportation  Needs (11/05/2021)   PRAPARE - Administrator, Civil Service (Medical): No    Lack of Transportation (Non-Medical): No  Physical Activity: Inactive (11/05/2021)   Exercise Vital Sign    Days of Exercise per Week: 0 days    Minutes of Exercise per Session: 0 min  Stress: No Stress Concern Present (11/05/2021)   [employer] of Occupational Health - Occupational Stress Questionnaire    Feeling of Stress : Not at all  Social Connections: Moderately Isolated (11/05/2021)   Social Connection and Isolation Panel [NHANES]    Frequency of Communication with Friends and Family: Three times a week    Frequency of Social Gatherings with Friends and Family: Twice a week    Attends Religious Services: Never    Database administrator or Organizations: No    Attends Engineer, structural: Never    Marital Status: Married    Tobacco Counseling Counseling given: Not Answered   Clinical  Intake:  Pre-visit preparation completed: Yes  Pain : No/denies pain     Nutritional Risks: None Diabetes: No  How often do you need to have someone help you when you read instructions, pamphlets, or other written materials from your doctor or pharmacy?: 1 - Never What is the last grade level you completed in school?: 12th grade  Diabetic: No  Interpreter Needed?: No      Activities of Daily Living    11/05/2021    1:13 PM  In your present state of health, do you have any difficulty performing the following activities:  Hearing? 0  Vision? 0  Difficulty concentrating or making decisions? 0  Walking or climbing stairs? 0  Dressing or bathing? 0  Doing errands, shopping? 0    Patient Care Team: Larae Grooms, NP as PCP - General Tiburcio Pea Harrel Lemon, MD as Referring Physician (Obstetrics and Gynecology) Kieth Brightly, MD (General Surgery)  Indicate any recent Medical Services you may have received from other than [facility] providers in the past year (date may be approximate).     Assessment:   This is a routine wellness examination for Beth James.  Hearing/Vision screen No results found.  Dietary issues and exercise activities discussed:     Goals Addressed   None   Depression Screen    11/05/2021    1:05 PM 09/10/2021    9:01 AM 03/13/2021    9:02 AM 10/18/2020    3:54 PM 07/14/2020   11:13 AM 01/31/2020    1:23 PM 07/05/2019   11:27 AM  PHQ 2/9 Scores  PHQ - 2 Score 0 0 0 0 0 0 3  PHQ- 9 Score 0 0 1 0 2  6    Fall Risk    09/10/2021    9:00 AM 10/18/2020    3:54 PM 07/14/2020   11:12 AM 01/31/2020    1:23 PM 12/02/2018   10:37 AM  Fall Risk   Falls in the past year? 0 0 0 0 0  Number falls in past yr: 0 0 0 0   Injury with Fall? 0 0 0 0   Risk for fall due to : No Fall Risks No Fall Risks No Fall Risks    Follow up Falls evaluation completed Falls evaluation completed Falls evaluation completed Falls evaluation completed Falls evaluation completed     FALL RISK PREVENTION PERTAINING TO THE HOME:  Any stairs in or around the home? Yes  If so, are there any without handrails? No  Home free  of loose throw rugs in walkways, pet beds, electrical cords, etc? No  Adequate lighting in your home to reduce risk of falls? Yes   ASSISTIVE DEVICES UTILIZED TO PREVENT FALLS:  Life alert? No  Use of a cane, walker or w/c? No  Grab bars in the bathroom? No  Shower chair or bench in shower? No  Elevated toilet seat or a handicapped toilet? No   TIMED UP AND GO:  Was the test performed? No .    Cognitive Function:        11/05/2021    1:13 PM  6CIT Screen  What Year? 0 points  What month? 0 points  What time? 0 points  Count back from 20 0 points  Months in reverse 0 points  Repeat phrase 0 points  Total Score 0 points    Immunizations Immunization History  Administered Date(s) Administered   Moderna Sars-Covid-2 Vaccination 11/26/2019   Tdap 08/15/2015    TDAP status: Up to date  Flu Vaccine status: Declined, Education has been provided regarding the importance of this vaccine but patient still declined. Advised may receive this vaccine at local pharmacy or Health Dept. Aware to provide a copy of the vaccination record if obtained from local pharmacy or Health Dept. Verbalized acceptance and understanding.  Pneumococcal vaccine status: Declined,  Education has been provided regarding the importance of this vaccine but patient still declined. Advised may receive this vaccine at local pharmacy or Health Dept. Aware to provide a copy of the vaccination record if obtained from local pharmacy or Health Dept. Verbalized acceptance and understanding.   Covid-19 vaccine status: Declined, Education has been provided regarding the importance of this vaccine but patient still declined. Advised may receive this vaccine at local pharmacy or Health Dept.or vaccine clinic. Aware to provide a copy of the vaccination record if obtained from  local pharmacy or Health Dept. Verbalized acceptance and understanding.  Qualifies for Shingles Vaccine? Yes   Zostavax completed No   Shingrix Completed?: No.    Education has been provided regarding the importance of this vaccine. Patient has been advised to call insurance company to determine out of pocket expense if they have not yet received this vaccine. Advised may also receive vaccine at local pharmacy or Health Dept. Verbalized acceptance and understanding.  Screening Tests Health Maintenance  Topic Date Due   MAMMOGRAM  03/02/2011   COVID-19 Vaccine (2 - Moderna series) 01/21/2020   DEXA SCAN  Never done   Zoster Vaccines- Shingrix (1 of 2) 02/04/2022 (Originally 07/05/2005)   INFLUENZA VACCINE  05/26/2022 (Originally 09/25/2021)   Pneumonia Vaccine 3+ Years old (1 - PCV) 11/06/2022 (Originally 07/05/2020)   Fecal DNA (Cologuard)  07/01/2023   TETANUS/TDAP  08/14/2025   Hepatitis C Screening  Completed   HPV VACCINES  Aged Out    Health Maintenance  Health Maintenance Due  Topic Date Due   MAMMOGRAM  03/02/2011   COVID-19 Vaccine (2 - Moderna series) 01/21/2020   DEXA SCAN  Never done    Colorectal cancer screening: Type of screening: Cologuard. Completed 06/30/20. Repeat every 3 years  Mammogram status: Ordered 11/05/21. Pt provided with contact info and advised to call to schedule appt.   Bone Density status: Ordered 11/05/21. Pt provided with contact info and advised to call to schedule appt.  Lung Cancer Screening: (Low Dose CT Chest recommended if Age 69-80 years, 30 pack-year currently smoking OR have quit w/in 15years.) does not qualify.   Lung Cancer Screening Referral: N/A  Additional Screening:  Hepatitis C Screening: does qualify; Completed 10/03/2017  Vision Screening: Recommended annual ophthalmology exams for early detection of glaucoma and other disorders of the eye. Is the patient up to date with their annual eye exam?  Yes   Dental Screening:  Recommended annual dental exams for proper oral hygiene  Community Resource Referral / Chronic Care Management: CRR required this visit?  No   CCM required this visit?  No      Plan:     I have personally reviewed and noted the following in the patient's chart:   Medical and social history Use of alcohol, tobacco or illicit drugs  Current medications and supplements including opioid prescriptions. Patient is not currently taking opioid prescriptions. Functional ability and status Nutritional status Physical activity Advanced directives List of other physicians Hospitalizations, surgeries, and ER visits in previous 12 months Vitals Screenings to include cognitive, depression, and falls Referrals and appointments  In addition, I have reviewed and discussed with patient certain preventive protocols, quality metrics, and best practice recommendations. A written personalized care plan for preventive services as well as general preventive health recommendations were provided to patient.     Pablo Ledger, [place]   11/05/2021   Nurse Notes:  Beth James , Thank you for taking time to come for your Medicare Wellness Visit. I appreciate your ongoing commitment to your health goals. Please review the following plan we discussed and let me know if I can assist you in the future.   These are the goals we discussed:  Goals   None     This is a list of the screening recommended for you and due dates:  Health Maintenance  Topic Date Due   Mammogram  03/02/2011   COVID-19 Vaccine (2 - Moderna series) 01/21/2020   DEXA scan (bone density measurement)  Never done   Zoster (Shingles) Vaccine (1 of 2) 02/04/2022*   Flu Shot  05/26/2022*   Pneumonia Vaccine (1 - PCV) 11/06/2022*   Cologuard (Stool DNA test)  07/01/2023   Tetanus Vaccine  08/14/2025   Hepatitis C Screening: USPSTF Recommendation to screen - Ages 18-79 yo.  Completed   HPV Vaccine  Aged Out  *Topic was postponed. The  date shown is not the original due date.

## 2021-12-03 ENCOUNTER — Other Ambulatory Visit: Payer: Self-pay | Admitting: Nurse Practitioner

## 2021-12-03 NOTE — Telephone Encounter (Signed)
Requested medications are due for refill today.  Unsure  Requested medications are on the active medications list.  yes  Last refill. 09/10/2021 #60 0 rf  Future visit scheduled.   no  Notes to clinic.  Per med list pt is no longer taking 11/05/2021    Requested Prescriptions  Pending Prescriptions Disp Refills   losartan (COZAAR) 25 MG tablet [Pharmacy Med Name: LOSARTAN 25 MG TAB[*]] 60 tablet 0    Sig: TAKE ONE TABLET BY MOUTH ONE TIME DAILY     Cardiovascular:  Angiotensin Receptor Blockers Failed - 12/03/2021  2:26 AM      Failed - Last BP in normal range    BP Readings from Last 1 Encounters:  09/10/21 (!) 147/83         Passed - Cr in normal range and within 180 days    Creatinine, Ser  Date Value Ref Range Status  09/10/2021 0.66 0.57 - 1.00 mg/dL Final         Passed - K in normal range and within 180 days    Potassium  Date Value Ref Range Status  09/10/2021 4.4 3.5 - 5.2 mmol/L Final         Passed - Patient is not pregnant      Passed - Valid encounter within last 6 months    Recent Outpatient Visits           2 months ago Essential hypertension   [facility] [name], NP   8 months ago Essential hypertension   [facility] [name], NP   1 year ago Pre-op exam   [facility] [name], NP   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

## 2021-12-05 ENCOUNTER — Ambulatory Visit
Admission: RE | Admit: 2021-12-05 | Discharge: 2021-12-05 | Disposition: A | Discharge status: Home / Self Care | Payer: Medicare Other | Source: Ambulatory Visit | Attending: Nurse Practitioner | Admitting: Nurse Practitioner

## 2021-12-05 DIAGNOSIS — Z1231 Encounter for screening mammogram for malignant neoplasm of breast: Secondary | ICD-10-CM | POA: Diagnosis present

## 2021-12-06 NOTE — Progress Notes (Signed)
Please let patient know her Mammogram did not show any evidence of a malignancy.  The recommendation is to repeat the Mammogram in 1 year.  

## 2021-12-11 ENCOUNTER — Ambulatory Visit: Payer: Medicare Other | Admitting: Nurse Practitioner

## 2021-12-12 ENCOUNTER — Telehealth: Payer: Medicare Other

## 2021-12-12 ENCOUNTER — Telehealth: Payer: Medicare Other | Admitting: Nurse Practitioner

## 2021-12-12 DIAGNOSIS — J069 Acute upper respiratory infection, unspecified: Secondary | ICD-10-CM | POA: Diagnosis not present

## 2021-12-12 MED ORDER — PROMETHAZINE-DM 6.25-15 MG/5ML PO SYRP: 5.0000 mL | ORAL_SOLUTION | Freq: Four times a day (QID) | ORAL | 0 refills | Status: DC | PRN

## 2021-12-12 MED ORDER — AZITHROMYCIN 250 MG PO TABS: ORAL_TABLET | ORAL | 0 refills | Status: DC

## 2021-12-12 NOTE — Patient Instructions (Signed)

## 2021-12-12 NOTE — Progress Notes (Signed)
Virtual Visit Consent   Beth James, you are scheduled for a virtual visit with Mary-Margaret Daphine Deutscher, FNP, a [facility] provider, today.     Just as with appointments in the office, your consent must be obtained to participate.  Your consent will be active for this visit and any virtual visit you may have with one of our providers in the next 365 days.     If you have a MyChart account, a copy of this consent can be sent to you electronically.  All virtual visits are billed to your insurance company just like a traditional visit in the office.    As this is a virtual visit, video technology does not allow for your provider to perform a traditional examination.  This may limit your provider's ability to fully assess your condition.  If your provider identifies any concerns that need to be evaluated in person or the need to arrange testing (such as labs, EKG, etc.), we will make arrangements to do so.     Although advances in technology are sophisticated, we cannot ensure that it will always work on either your end or our end.  If the connection with a video visit is poor, the visit may have to be switched to a telephone visit.  With either a video or telephone visit, we are not always able to ensure that we have a secure connection.     I need to obtain your verbal consent now.   Are you willing to proceed with your visit today? YES   KOSHA JAQUITH has provided verbal consent on 12/12/2021 for a virtual visit (video or telephone).   Mary-Margaret Daphine Deutscher, FNP   Date: 12/12/2021 11:57 AM   Virtual Visit via Video Note   I, Mary-Margaret Daphine Deutscher, connected with JENNFER GASSEN ([id number], [birth year]) on 12/12/21 at 12:00 PM EDT by a video-enabled telemedicine application and verified that I am speaking with the correct person using two identifiers.  Location: Patient: Virtual Visit Location Patient: Home Provider: Virtual Visit Location Provider: Mobile   I discussed the  limitations of evaluation and management by telemedicine and the availability of in person appointments. The patient expressed understanding and agreed to proceed.    History of Present Illness: Beth James is a 66 y.o. who identifies as a female who was assigned female at birth, and is being seen today for uri with cough .  HPI: Sore Throat  The pain is worse on the right side. Associated symptoms include congestion, coughing and trouble swallowing. Pertinent negatives include no headaches, shortness of breath or swollen glands. She has tried acetaminophen (robitussin and nyquil) for the symptoms. The treatment provided mild relief.    Review of Systems  HENT:  Positive for congestion and trouble swallowing.   Respiratory:  Positive for cough. Negative for shortness of breath.   Neurological:  Negative for headaches.    Problems:  Patient Active Problem List   Diagnosis Date Noted   History of total knee arthroplasty 11/09/2020   Osteoarthritis of left knee 10/31/2020   Essential hypertension 04/13/2018   Anxiety 10/03/2017   Hyperlipidemia 10/03/2017   Atrophic vaginitis 08/15/2015   Rhinitis, allergic 08/15/2015   Gastroesophageal reflux disease without esophagitis 08/15/2015   Generalized OA 08/15/2015   Obesity 08/15/2015    Allergies:  Allergies  Allergen Reactions   Amlodipine Other (See Comments)    jittery   Codeine Other (See Comments)    jittery   Lipitor [Atorvastatin] Other (See Comments)  Myalgias   Lisinopril Other (See Comments)    myalgias   Tape Rash    Needs to use paper tape   Medications:  Current Outpatient Medications:    esomeprazole (NEXIUM) 20 MG capsule, Take 1 capsule (20 mg total) by mouth daily at 12 noon., Disp: 90 capsule, Rfl: 1   ezetimibe (ZETIA) 10 MG tablet, Take 1 tablet (10 mg total) by mouth daily., Disp: 90 tablet, Rfl: 1   losartan (COZAAR) 25 MG tablet, Take 1 tablet (25 mg total) by mouth daily. (Patient not taking:  Reported on 11/05/2021), Disp: 60 tablet, Rfl: 0   Multiple Vitamin (MULTIVITAMIN) tablet, Take 1 tablet by mouth daily., Disp: , Rfl:    sertraline (ZOLOFT) 25 MG tablet, TAKE 1 TABLET BY MOUTH DAILY, Disp: 90 tablet, Rfl: 1  Observations/Objective: Patient is well-developed, well-nourished in no acute distress.  Resting comfortably  at home.  Head is normocephalic, atraumatic.  No labored breathing.  Speech is clear and coherent with logical content.  Patient is alert and oriented at baseline.  Voice raspy Deep wet cough  Assessment and Plan:  [name] in today with chief complaint of Sore Throat   1. URI with cough and congestion 1. Take meds as prescribed 2. Use a cool mist humidifier especially during the winter months and when heat has been humid. 3. Use saline nose sprays frequently 4. Saline irrigations of the nose can be very helpful if done frequently.  * 4X daily for 1 week*  * Use of a nettie pot can be helpful with this. Follow directions with this* 5. Drink plenty of fluids 6. Keep thermostat turn down low 7.For any cough or congestion- promethazine DM 8. For fever or aces or pains- take tylenol or ibuprofen appropriate for age and weight.  * for fevers greater than 101 orally you may alternate ibuprofen and tylenol every  3 hours.     Follow Up Instructions: I discussed the assessment and treatment plan with the patient. The patient was provided an opportunity to ask questions and all were answered. The patient agreed with the plan and demonstrated an understanding of the instructions.  A copy of instructions were sent to the patient via MyChart.  The patient was advised to call back or seek an in-person evaluation if the symptoms worsen or if the condition fails to improve as anticipated.  Time:  I spent 6 minutes with the patient via telehealth technology discussing the above problems/concerns.    Mary-Margaret Hassell Done, FNP

## 2022-01-08 ENCOUNTER — Other Ambulatory Visit: Payer: Self-pay | Admitting: Nurse Practitioner

## 2022-01-08 ENCOUNTER — Encounter: Payer: Self-pay | Admitting: Nurse Practitioner

## 2022-02-01 ENCOUNTER — Other Ambulatory Visit: Payer: Self-pay | Admitting: Nurse Practitioner

## 2022-02-01 NOTE — Telephone Encounter (Signed)
Requested medications are due for refill today.  yes  Requested medications are on the active medications list.  yes  Last refill. 09/10/2021 #60 0 rf  Future visit scheduled.   no  Notes to clinic.  Med list states that pt is no longer taking as of 11/05/2021    Requested Prescriptions  Pending Prescriptions Disp Refills   losartan (COZAAR) 25 MG tablet [Pharmacy Med Name: LOSARTAN 25 MG TAB[*]] 60 tablet 0    Sig: TAKE ONE TABLET BY MOUTH ONE TIME DAILY     Cardiovascular:  Angiotensin Receptor Blockers Failed - 02/01/2022  2:27 AM      Failed - Last BP in normal range    BP Readings from Last 1 Encounters:  09/10/21 (!) 147/83         Passed - Cr in normal range and within 180 days    Creatinine, Ser  Date Value Ref Range Status  09/10/2021 0.66 0.57 - 1.00 mg/dL Final         Passed - K in normal range and within 180 days    Potassium  Date Value Ref Range Status  09/10/2021 4.4 3.5 - 5.2 mmol/L Final         Passed - Patient is not pregnant      Passed - Valid encounter within last 6 months    Recent Outpatient Visits           4 months ago Essential hypertension   [facility] Larae Grooms, NP   10 months ago Essential hypertension   [facility] Larae Grooms, NP   1 year ago Pre-op exam   [facility] Larae Grooms, NP   1 year ago Generalized OA   [facility] Larae Grooms, NP   1 year ago Suspected COVID-19 virus infection   [facility] [place], [place], DO

## 2022-03-16 ENCOUNTER — Other Ambulatory Visit: Payer: Self-pay | Admitting: Nurse Practitioner

## 2022-03-18 NOTE — Telephone Encounter (Signed)
Requested Prescriptions  Pending Prescriptions Disp Refills   esomeprazole (NEXIUM) 20 MG capsule [Pharmacy Med Name: ESOMEPRAZOLE DR 20 MG CAP[*]] 90 capsule 0    Sig: TAKE ONE CAPSULE BY MOUTH ONE TIME DAILY AT [place]     Gastroenterology: Proton Pump Inhibitors 2 Passed - 03/16/2022  2:24 AM      Passed - ALT in normal range and within 360 days    ALT  Date Value Ref Range Status  09/10/2021 8 0 - 32 IU/L Final         Passed - AST in normal range and within 360 days    AST  Date Value Ref Range Status  09/10/2021 12 0 - 40 IU/L Final         Passed - Valid encounter within last 12 months    Recent Outpatient Visits           6 months ago Essential hypertension   [place] [name], NP   1 year ago Essential hypertension   [place] [name], NP   1 year ago Pre-op exam   [place] [name], NP   1 year ago Generalized [place], NP   2 years ago Suspected COVID-19 virus infection   [place], [place], DO

## 2022-03-25 ENCOUNTER — Other Ambulatory Visit: Payer: Self-pay | Admitting: Nurse Practitioner

## 2022-03-26 NOTE — Telephone Encounter (Signed)
Requested Prescriptions  Pending Prescriptions Disp Refills   ezetimibe (ZETIA) 10 MG tablet [Pharmacy Med Name: EZETIMIBE 10 MG TAB[*]] 90 tablet 0    Sig: TAKE ONE TABLET BY MOUTH ONE TIME DAILY     Cardiovascular:  Antilipid - Sterol Transport Inhibitors Failed - 03/25/2022 11:45 AM      Failed - Lipid Panel in normal range within the last 12 months    Cholesterol, Total  Date Value Ref Range Status  09/10/2021 215 (H) 100 - 199 mg/dL Final   LDL Chol Calc (NIH)  Date Value Ref Range Status  09/10/2021 126 (H) 0 - 99 mg/dL Final   HDL  Date Value Ref Range Status  09/10/2021 42 >39 mg/dL Final   Triglycerides  Date Value Ref Range Status  09/10/2021 265 (H) 0 - 149 mg/dL Final         Passed - AST in normal range and within 360 days    AST  Date Value Ref Range Status  09/10/2021 12 0 - 40 IU/L Final         Passed - ALT in normal range and within 360 days    ALT  Date Value Ref Range Status  09/10/2021 8 0 - 32 IU/L Final         Passed - Patient is not pregnant      Passed - Valid encounter within last 12 months    Recent Outpatient Visits           6 months ago Essential hypertension   [place], Karen, NP   1 year ago Essential hypertension   [place] [name], NP   1 year ago Pre-op exam   [place] [name], NP   1 year ago Generalized [place], NP   2 years ago Suspected COVID-19 virus infection   [place], [place], DO

## 2022-06-25 ENCOUNTER — Other Ambulatory Visit: Payer: Self-pay | Admitting: Nurse Practitioner

## 2022-06-26 NOTE — Telephone Encounter (Signed)
Patient needs OV, will refill medication for 30 days until OV can be made. OV needed for additional refills.  Requested Prescriptions  Pending Prescriptions Disp Refills   sertraline (ZOLOFT) 25 MG tablet [Pharmacy Med Name: SERTRALINE 25 MG TAB[*]] 30 tablet 0    Sig: TAKE ONE TABLET BY MOUTH ONE TIME DAILY     Psychiatry:  Antidepressants - SSRI - sertraline Failed - 06/25/2022  6:46 PM      Failed - Valid encounter within last 6 months    Recent Outpatient Visits           9 months ago Essential hypertension   [place] [facility] Larae Grooms, NP   1 year ago Essential hypertension   [place] [facility] Larae Grooms, NP   1 year ago Pre-op exam   [place] [facility] Larae Grooms, NP   1 year ago Generalized OA   [place] [facility] [place], Clydie [place], NP   2 years ago Suspected COVID-19 virus infection   [place] [facility], Megan P, DO              Passed - AST in normal range and within 360 days    AST  Date Value Ref Range Status  09/10/2021 12 0 - 40 IU/L Final         Passed - ALT in normal range and within 360 days    ALT  Date Value Ref Range Status  09/10/2021 8 0 - 32 IU/L Final         Passed - Completed PHQ-2 or PHQ-9 in the last 360 days

## 2022-07-13 ENCOUNTER — Other Ambulatory Visit: Payer: Self-pay | Admitting: Nurse Practitioner

## 2022-07-15 NOTE — Telephone Encounter (Signed)
Requested Prescriptions  Pending Prescriptions Disp Refills   ezetimibe (ZETIA) 10 MG tablet [Pharmacy Med Name: EZETIMIBE 10 MG TAB[*]] 90 tablet 0    Sig: TAKE 1 TABLET BY MOUTH ONE TIME A DAY.     Cardiovascular:  Antilipid - Sterol Transport Inhibitors Failed - 07/13/2022 10:55 AM      Failed - Lipid Panel in normal range within the last 12 months    Cholesterol, Total  Date Value Ref Range Status  09/10/2021 215 (H) 100 - 199 mg/dL Final   LDL Chol Calc (NIH)  Date Value Ref Range Status  09/10/2021 126 (H) 0 - 99 mg/dL Final   HDL  Date Value Ref Range Status  09/10/2021 42 >39 mg/dL Final   Triglycerides  Date Value Ref Range Status  09/10/2021 265 (H) 0 - 149 mg/dL Final         Passed - AST in normal range and within 360 days    AST  Date Value Ref Range Status  09/10/2021 12 0 - 40 IU/L Final         Passed - ALT in normal range and within 360 days    ALT  Date Value Ref Range Status  09/10/2021 8 0 - 32 IU/L Final         Passed - Patient is not pregnant      Passed - Valid encounter within last 12 months    Recent Outpatient Visits           10 months ago Essential hypertension   [place] [facility] Larae Grooms, NP   1 year ago Essential hypertension   [place] [facility] Larae Grooms, NP   1 year ago Pre-op exam   [place] [facility] Larae Grooms, NP   1 year ago Generalized OA   [place] [facility] Larae Grooms, NP   2 years ago Suspected COVID-19 virus infection   [place] [facility] [place], [place], DO

## 2022-07-29 ENCOUNTER — Encounter: Payer: Self-pay | Admitting: Nurse Practitioner

## 2022-07-29 ENCOUNTER — Ambulatory Visit (INDEPENDENT_AMBULATORY_CARE_PROVIDER_SITE_OTHER): Payer: Medicare Other | Admitting: Nurse Practitioner

## 2022-07-29 VITALS — BP 129/86 | HR 89 | Temp 98.4°F | Ht <= 58 in | Wt 184.8 lb

## 2022-07-29 DIAGNOSIS — R7309 Other abnormal glucose: Secondary | ICD-10-CM

## 2022-07-29 DIAGNOSIS — I10 Essential (primary) hypertension: Secondary | ICD-10-CM

## 2022-07-29 DIAGNOSIS — F419 Anxiety disorder, unspecified: Secondary | ICD-10-CM | POA: Diagnosis not present

## 2022-07-29 DIAGNOSIS — Z6836 Body mass index (BMI) 36.0-36.9, adult: Secondary | ICD-10-CM

## 2022-07-29 DIAGNOSIS — E782 Mixed hyperlipidemia: Secondary | ICD-10-CM | POA: Diagnosis not present

## 2022-07-29 MED ORDER — LOSARTAN POTASSIUM 25 MG PO TABS: 25.0000 mg | ORAL_TABLET | Freq: Every day | ORAL | 1 refills | Status: DC

## 2022-07-29 MED ORDER — EZETIMIBE 10 MG PO TABS: ORAL_TABLET | ORAL | 1 refills | Status: DC

## 2022-07-29 MED ORDER — SERTRALINE HCL 25 MG PO TABS: ORAL_TABLET | ORAL | 1 refills | Status: DC

## 2022-07-29 NOTE — Progress Notes (Signed)
BP 129/86   Pulse 89   Temp 98.4 F (36.9 C) (Oral)   Ht 4' 9.48" (1.46 m)   Wt 184 lb 12.8 oz (83.8 kg)   LMP  (LMP Unknown)   SpO2 99%   BMI 39.33 kg/m    Subjective:    Patient ID: Beth James, female    DOB: 04/24/1955, 67 y.o.   MRN: [id number]  HPI: Beth James is a 67 y.o. female  Chief Complaint  Patient presents with   Medication Refill   HYPERTENSION Hypertension status: controlled  Satisfied with current treatment? yes Duration of hypertension: years BP monitoring frequency:  weekly BP range: 130-140/80 BP medication side effects:  no Medication compliance: excellent compliance Previous BP meds: Aspirin: no Recurrent headaches: no Visual changes: no Palpitations: no Dyspnea: no Chest pain: no Lower extremity edema: no Dizzy/lightheaded: no  HYPERLIPIDEMIA Patient started Zetia she is tolerating it well.  Denies concerns regarding medication at visit today. Does not see any side effects with the medication.  The 10-year ASCVD risk score (Arnett DK, et al., 2019) is: 10.5%   Values used to calculate the score:     Age: 38 years     Sex: Female     Is Non-Hispanic African American: No     Diabetic: No     Tobacco smoker: No     Systolic Blood Pressure: 129 mmHg     Is BP treated: Yes     HDL Cholesterol: 42 mg/dL     Total Cholesterol: 215 mg/dL  Flowsheet Row Office Visit [facility] 07/29/2022 in [facility]  PHQ-9 Total Score 6         07/29/2022    3:01 PM 09/10/2021    9:01 AM 03/13/2021    9:02 AM 07/14/2020   11:12 AM  GAD 7 : Generalized Anxiety Score  Nervous, Anxious, on Edge 1 0 1 2  Control/stop worrying 1 0 0 1  Worry too much - different things 0 0 0 1  Trouble relaxing 0  0 0  Restless 0 0 0 0  Easily annoyed or irritable 0 0 0 0  Afraid - awful might happen 0 0 0 1  Total GAD 7 Score 2  1 5   Anxiety Difficulty Not difficult at all Not difficult at all Not difficult at all Not difficult at all       Relevant past medical, surgical, family and social history reviewed and updated as indicated. Interim medical history since our last visit reviewed. Allergies and medications reviewed and updated.  Review of Systems  Eyes:  Negative for visual disturbance.  Respiratory:  Negative for cough, chest tightness and shortness of breath.   Cardiovascular:  Negative for chest pain, palpitations and leg swelling.  Neurological:  Negative for dizziness and headaches.  Psychiatric/Behavioral:  Negative for dysphoric mood. The patient is nervous/anxious.     Per HPI unless specifically indicated above     Objective:    BP 129/86   Pulse 89   Temp 98.4 F (36.9 C) (Oral)   Ht 4' 9.48" (1.46 m)   Wt 184 lb 12.8 oz (83.8 kg)   LMP  (LMP Unknown)   SpO2 99%   BMI 39.33 kg/m   Wt Readings from Last 3 Encounters:  07/29/22 184 lb 12.8 oz (83.8 kg)  09/10/21 192 lb 9.6 oz (87.4 kg)  03/13/21 181 lb 9.6 oz (82.4 kg)    Physical Exam Vitals and nursing note reviewed.  Constitutional:      General: She is not in acute distress.    Appearance: Normal appearance. She is obese. She is not ill-appearing, toxic-appearing or diaphoretic.  HENT:     Head: Normocephalic.     Right Ear: External ear normal.     Left Ear: External ear normal.     Nose: Nose normal.     Mouth/Throat:     Mouth: Mucous membranes are moist.     Pharynx: Oropharynx is clear.  Eyes:     General:        Right eye: No discharge.        Left eye: No discharge.     Extraocular Movements: Extraocular movements intact.     Conjunctiva/sclera: Conjunctivae normal.     Pupils: Pupils are equal, round, and reactive to light.  Cardiovascular:     Rate and Rhythm: Normal rate and regular rhythm.     Heart sounds: No murmur heard. Pulmonary:     Effort: Pulmonary effort is normal. No respiratory distress.     Breath sounds: Normal breath sounds. No wheezing or rales.  Musculoskeletal:     Cervical back: Normal  range of motion and neck supple.  Skin:    General: Skin is warm and dry.     Capillary Refill: Capillary refill takes less than 2 seconds.  Neurological:     General: No focal deficit present.     Mental Status: She is alert and oriented to person, place, and time. Mental status is at baseline.  Psychiatric:        Mood and Affect: Mood normal.        Behavior: Behavior normal.        Thought Content: Thought content normal.        Judgment: Judgment normal.     Results for orders placed or performed in visit on 09/10/21  Comp Met (CMET)  Result Value Ref Range   Glucose 94 70 - 99 mg/dL   BUN 14 8 - 27 mg/dL   Creatinine, Ser 1.61 0.57 - 1.00 mg/dL   eGFR 97 >09 UE/AVW/0.98   BUN/Creatinine Ratio 21 12 - 28   Sodium 139 134 - 144 mmol/L   Potassium 4.4 3.5 - 5.2 mmol/L   Chloride 101 96 - 106 mmol/L   CO2 25 20 - 29 mmol/L   Calcium 9.6 8.7 - 10.3 mg/dL   Total Protein 6.5 6.0 - 8.5 g/dL   Albumin 4.2 3.9 - 4.9 g/dL   Globulin, Total 2.3 1.5 - 4.5 g/dL   Albumin/Globulin Ratio 1.8 1.2 - 2.2   Bilirubin Total 0.3 0.0 - 1.2 mg/dL   Alkaline Phosphatase 100 44 - 121 IU/L   AST 12 0 - 40 IU/L   ALT 8 0 - 32 IU/L  Lipid Profile  Result Value Ref Range   Cholesterol, Total 215 (H) 100 - 199 mg/dL   Triglycerides 119 (H) 0 - 149 mg/dL   HDL 42 >14 mg/dL   VLDL Cholesterol Cal 47 (H) 5 - 40 mg/dL   LDL Chol Calc (NIH) 782 (H) 0 - 99 mg/dL   Chol/HDL Ratio 5.1 (H) 0.0 - 4.4 ratio      Assessment & Plan:   Problem List Items Addressed This Visit       Cardiovascular and Mediastinum   Essential hypertension   Relevant Medications   losartan (COZAAR) 25 MG tablet   ezetimibe (ZETIA) 10 MG tablet   Other Relevant Orders   Comp  Met (CMET)     Other   Obesity   Anxiety   Relevant Medications   sertraline (ZOLOFT) 25 MG tablet   Hyperlipidemia - Primary   Relevant Medications   losartan (COZAAR) 25 MG tablet   ezetimibe (ZETIA) 10 MG tablet   Other Relevant  Orders   Comp Met (CMET)   Lipid Profile     Follow up plan: Return in about 6 months (around 01/28/2023) for HTN, HLD, DM2 FU.

## 2022-07-29 NOTE — Assessment & Plan Note (Signed)
Chronic.  Controlled.  Continue with current medication regimen of Anxiety.  Refills sent today.  Labs ordered today.  Return to clinic in 6 months for reevaluation.  Call sooner if concerns arise.

## 2022-07-29 NOTE — Assessment & Plan Note (Signed)
Recommended eating smaller high protein, low fat meals more frequently and exercising 30 mins a day 5 times a week with a goal of 10-15lb weight loss in the next 3 months.  

## 2022-07-29 NOTE — Assessment & Plan Note (Signed)
Chronic.  Controlled.  Continue with current medication regimen of Zetia 10mg. Refills sent today.  Labs ordered today.  Return to clinic in 6 months for reevaluation.  Call sooner if concerns arise.   

## 2022-07-29 NOTE — Assessment & Plan Note (Signed)
Chronic. Controlled.  Has not tolerated Amlodipine or Lisinopril. Tolerating Losartan 25mg  daily.  Side effects and benefits discussed at visit today.  Labs ordered.  Follow up in 6 months for reevaluation.  Call sooner if concerns arise.

## 2022-07-30 LAB — COMPREHENSIVE METABOLIC PANEL
ALT: 11 IU/L (ref 0–32)
AST: 14 IU/L (ref 0–40)
Albumin/Globulin Ratio: 1.8 (ref 1.2–2.2)
Albumin: 4.2 g/dL (ref 3.9–4.9)
Alkaline Phosphatase: 100 IU/L (ref 44–121)
BUN/Creatinine Ratio: 32 — ABNORMAL HIGH (ref 12–28)
BUN: 20 mg/dL (ref 8–27)
Bilirubin Total: 0.2 mg/dL (ref 0.0–1.2)
CO2: 22 mmol/L (ref 20–29)
Calcium: 9.2 mg/dL (ref 8.7–10.3)
Chloride: 102 mmol/L (ref 96–106)
Creatinine, Ser: 0.62 mg/dL (ref 0.57–1.00)
Globulin, Total: 2.4 g/dL (ref 1.5–4.5)
Glucose: 147 mg/dL — ABNORMAL HIGH (ref 70–99)
Potassium: 3.9 mmol/L (ref 3.5–5.2)
Sodium: 141 mmol/L (ref 134–144)
Total Protein: 6.6 g/dL (ref 6.0–8.5)
eGFR: 98 mL/min/{1.73_m2} (ref >59)

## 2022-07-30 LAB — LIPID PANEL
Chol/HDL Ratio: 5.1 ratio — ABNORMAL HIGH (ref 0.0–4.4)
Cholesterol, Total: 194 mg/dL (ref 100–199)
HDL: 38 mg/dL — ABNORMAL LOW (ref >39)
LDL Chol Calc (NIH): 89 mg/dL (ref 0–99)
Triglycerides: 407 mg/dL — ABNORMAL HIGH (ref 0–149)
VLDL Cholesterol Cal: 67 mg/dL — ABNORMAL HIGH (ref 5–40)

## 2022-07-30 NOTE — Progress Notes (Signed)
Hi Beth James. It was nice to see you yesterday.  Your lab work looks good.  Your triglycerides are elevated.  I recommend decreasing processed foods and refined sugar intake.  Your glucose is also elevated.  I put in an order for you to come back and have an A1c drawn which is a measure of diabetes.  I will let you know what this says once it returns.  No other concerns at this time. Continue with your current medication regimen.  Follow up as discussed.  Please let me know if you have any questions.

## 2022-07-30 NOTE — Addendum Note (Signed)
Addended by: Larae Grooms on: 07/30/2022 08:07 AM   Modules accepted: Orders

## 2022-12-28 ENCOUNTER — Encounter: Payer: Self-pay | Admitting: Nurse Practitioner

## 2023-01-02 MED ORDER — SERTRALINE HCL 25 MG PO TABS: ORAL_TABLET | ORAL | 0 refills | Status: DC

## 2023-01-02 MED ORDER — LOSARTAN POTASSIUM 25 MG PO TABS: 25.0000 mg | ORAL_TABLET | Freq: Every day | ORAL | 0 refills | Status: DC

## 2023-01-02 MED ORDER — EZETIMIBE 10 MG PO TABS: ORAL_TABLET | ORAL | 0 refills | Status: DC

## 2023-01-23 ENCOUNTER — Other Ambulatory Visit: Payer: Self-pay | Admitting: Nurse Practitioner

## 2023-01-27 NOTE — Telephone Encounter (Signed)
Requested Prescriptions  Pending Prescriptions Disp Refills   sertraline (ZOLOFT) 25 MG tablet [Pharmacy Med Name: Sertraline Hydrochloride 25mg  Tablet] 30 tablet 0    Sig: Take 1 tablet by mouth once daily.     Psychiatry:  Antidepressants - SSRI - sertraline Passed - 01/23/2023  1:38 PM      Passed - AST in normal range and within 360 days    AST  Date Value Ref Range Status  07/29/2022 14 0 - 40 IU/L Final         Passed - ALT in normal range and within 360 days    ALT  Date Value Ref Range Status  07/29/2022 11 0 - 32 IU/L Final         Passed - Completed PHQ-2 or PHQ-9 in the last 360 days      Passed - Valid encounter within last 6 months    Recent Outpatient Visits           6 months ago Mixed hyperlipidemia   [place] [facility] Larae Grooms, NP   1 year ago Essential hypertension   [place] [facility] Larae Grooms, NP   1 year ago Essential hypertension   [place] [facility] Larae Grooms, NP   2 years ago Pre-op exam   [place] [facility] Larae Grooms, NP   2 years ago Generalized OA   [place] [facility] Larae Grooms, NP

## 2023-02-04 ENCOUNTER — Other Ambulatory Visit: Payer: Self-pay | Admitting: Nurse Practitioner

## 2023-02-05 NOTE — Telephone Encounter (Signed)
Requested Prescriptions  Pending Prescriptions Disp Refills   sertraline (ZOLOFT) 25 MG tablet [Pharmacy Med Name: SERTRALINE 25 MG TAB[*]] 90 tablet 1    Sig: TAKE ONE TABLET BY MOUTH ONE TIME DAILY     Psychiatry:  Antidepressants - SSRI - sertraline Failed - 02/04/2023  6:46 PM      Failed - Valid encounter within last 6 months    Recent Outpatient Visits           6 months ago Mixed hyperlipidemia   [place] [facility] Larae Grooms, NP   1 year ago Essential hypertension   [place] [facility] Larae Grooms, NP   1 year ago Essential hypertension   [place] [facility] Larae Grooms, NP   2 years ago Pre-op exam   [place] [facility] Larae Grooms, NP   2 years ago Generalized OA   [place] [facility] Larae Grooms, NP              Passed - AST in normal range and within 360 days    AST  Date Value Ref Range Status  07/29/2022 14 0 - 40 IU/L Final         Passed - ALT in normal range and within 360 days    ALT  Date Value Ref Range Status  07/29/2022 11 0 - 32 IU/L Final         Passed - Completed PHQ-2 or PHQ-9 in the last 360 days

## 2023-02-18 ENCOUNTER — Other Ambulatory Visit: Payer: Self-pay | Admitting: Nurse Practitioner

## 2023-02-20 NOTE — Telephone Encounter (Signed)
Patient is overdue for appointment. Please call to schedule and then route to provider for refill.

## 2023-02-20 NOTE — Telephone Encounter (Signed)
Requested medication (s) are due for refill today: Yes  Requested medication (s) are on the active medication list: Yes  Last refill:  01/27/23  Future visit scheduled: No  Notes to clinic:  Unable to refill per protocol, courtesy refill already given, routing for provider approval. Patient notified via MyChart message appointment is needed.     Requested Prescriptions  Pending Prescriptions Disp Refills   sertraline (ZOLOFT) 25 MG tablet [Pharmacy Med Name: Sertraline Hydrochloride 25mg  Tablet] 30 tablet 0    Sig: Take 1 tablet by mouth once daily.     Psychiatry:  Antidepressants - SSRI - sertraline Failed - 02/20/2023 11:36 AM      Failed - Valid encounter within last 6 months    Recent Outpatient Visits           6 months ago Mixed hyperlipidemia   [place] [facility] Larae Grooms, NP   1 year ago Essential hypertension   [place] [facility] Larae Grooms, NP   1 year ago Essential hypertension   [place] [facility] Larae Grooms, NP   2 years ago Pre-op exam   [place] [facility] Larae Grooms, NP   2 years ago Generalized OA   [place] [facility] Larae Grooms, NP              Passed - AST in normal range and within 360 days    AST  Date Value Ref Range Status  07/29/2022 14 0 - 40 IU/L Final         Passed - ALT in normal range and within 360 days    ALT  Date Value Ref Range Status  07/29/2022 11 0 - 32 IU/L Final         Passed - Completed PHQ-2 or PHQ-9 in the last 360 days

## 2023-02-20 NOTE — Telephone Encounter (Signed)
Pt has an appointment scheduled on 03/24/2023 @ 10:40 am, routing to provider for refill til appointment.

## 2023-03-16 ENCOUNTER — Other Ambulatory Visit: Payer: Self-pay | Admitting: Nurse Practitioner

## 2023-03-17 NOTE — Telephone Encounter (Signed)
Requested medication (s) are due for refill today:   Yes  Requested medication (s) are on the active medication list:   Yes  Future visit scheduled:   Yes 03/24/2023   Last ordered: 02/20/2023 #30, 0 refill   Returned because courtesy refill was given 12/26.    Provider to review for refills prior to upcoming appt on 1/27  Requested Prescriptions  Pending Prescriptions Disp Refills   sertraline (ZOLOFT) 25 MG tablet [Pharmacy Med Name: Sertraline Hydrochloride 25mg  Tablet] 30 tablet 0    Sig: Take 1 tablet by mouth once daily.     Psychiatry:  Antidepressants - SSRI - sertraline Failed - 03/17/2023  2:46 PM      Failed - Valid encounter within last 6 months    Recent Outpatient Visits           7 months ago Mixed hyperlipidemia   [place] [facility] Larae Grooms, NP   1 year ago Essential hypertension   [place] [facility] Larae Grooms, NP   2 years ago Essential hypertension   [place] [facility] Larae Grooms, NP   2 years ago Pre-op exam   [place] [facility] Larae Grooms, NP   2 years ago Generalized OA   [place] [facility] Larae Grooms, NP       Future Appointments             In 1 week Larae Grooms, NP [place] [facility]            Passed - AST in normal range and within 360 days    AST  Date Value Ref Range Status  07/29/2022 14 0 - 40 IU/L Final         Passed - ALT in normal range and within 360 days    ALT  Date Value Ref Range Status  07/29/2022 11 0 - 32 IU/L Final         Passed - Completed PHQ-2 or PHQ-9 in the last 360 days

## 2023-03-24 ENCOUNTER — Ambulatory Visit: Payer: Medicare Other | Admitting: Nurse Practitioner

## 2023-03-24 NOTE — Progress Notes (Deleted)
 LMP  (LMP Unknown)    Subjective:    Patient ID: Beth James, female    DOB: [birth year], 68 y.o.   MRN: [id number]  HPI: Beth James is a 67 y.o. female presenting on 03/24/2023 for comprehensive medical examination. Current medical complaints include:none  She currently lives with: Husband Menopausal Symptoms: no  HYPERTENSION / HYPERLIPIDEMIA Satisfied with current treatment? yes Duration of hypertension: years BP monitoring frequency:  occassionally BP range: 140/80 BP medication side effects: no Past BP meds: none Duration of hyperlipidemia: years Cholesterol medication side effects: no Cholesterol supplements: red yeast rice Past cholesterol medications: atorvastain (lipitor) Medication compliance: excellent compliance Aspirin: no Recent stressors: yes Recurrent headaches: no Visual changes: no Palpitations: no Dyspnea: no Chest pain: no Lower extremity edema: no Dizzy/lightheaded: no  Patient complains of muscle aches. Feels like muscles are very tender all of the time.  Denies stiff hands in the morning.  Denies weight gain. Patient reports fatigue or lack of energy.  Feels like she has to make herself do things. Wondering if her arthritis is more than osteoarthritis.  Has been told in the past that she has Fibromyalgia.  Has never pursued treatment.  Patient states the Voltaren helps with her pain.  She takes it once daily.  However, she has been having some GI upset and wondering if it is from the Voltaren.   Functional Status Survey:        07/29/2022    3:00 PM 09/10/2021    9:00 AM 10/18/2020    3:54 PM 07/14/2020   11:12 AM 01/31/2020    1:23 PM  Fall Risk   Falls in the past year? 0 0 0 0 0  Number falls in past yr: 0 0 0 0 0  Injury with Fall? 0 0 0 0 0  Risk for fall due to : No Fall Risks No Fall Risks No Fall Risks No Fall Risks   Follow up Falls evaluation completed Falls evaluation completed Falls evaluation completed Falls evaluation  completed Falls evaluation completed    Depression Screen    07/29/2022    3:00 PM 11/05/2021    1:05 PM 09/10/2021    9:01 AM 03/13/2021    9:02 AM 10/18/2020    3:54 PM  Depression screen PHQ 2/9  Decreased Interest 0 0 0 0 0  Down, Depressed, Hopeless 0 0 0 0 0  PHQ - 2 Score 0 0 0 0 0  Altered sleeping 3 0 0 0 0  Tired, decreased energy 3 0 0 1 0  Change in appetite 0 0 0 0 0  Feeling bad or failure about yourself  0 0 0 0 0  Trouble concentrating 0 0 0 0 0  Moving slowly or fidgety/restless 0 0 0 0 0  Suicidal thoughts 0 0 0 0 0  PHQ-9 Score 6 0 0 1 0  Difficult doing work/chores Not difficult at all Not difficult at all Not difficult at all  Not difficult at all     Advanced Directives Does patient have a HCPOA?    no If yes, name and contact information:  Does patient have a living will or MOST form?  no  Past Medical History:  Past Medical History:  Diagnosis Date  . Allergy   . Anxiety   . Depression   . GERD (gastroesophageal reflux disease)   . Hyperlipidemia   . Hypertension     Surgical History:  Past Surgical History:  Procedure Laterality Date  .  bone spur removal     left shoulder  . cartilage removal     left knee  . Clogged Tear Duct Stint placed      Medications:  Current Outpatient Medications on File Prior to Visit  Medication Sig  . ezetimibe (ZETIA) 10 MG tablet TAKE 1 TABLET BY MOUTH ONE TIME A DAY.  Marland Kitchen losartan (COZAAR) 25 MG tablet Take 1 tablet (25 mg total) by mouth daily.  . Multiple Vitamin (MULTIVITAMIN) tablet Take 1 tablet by mouth daily.  . sertraline (ZOLOFT) 25 MG tablet Take 1 tablet by mouth once daily.   No current facility-administered medications on file prior to visit.    Allergies:  Allergies  Allergen Reactions  . Amlodipine Other (See Comments)    jittery  . Codeine Other (See Comments)    jittery  . Lipitor [Atorvastatin] Other (See Comments)    Myalgias  . Lisinopril Other (See Comments)    myalgias  .  Tape Rash    Needs to use paper tape    Social History:  Social History   Socioeconomic History  . Marital status: Married    Spouse name: Not on file  . Number of children: Not on file  . Years of education: Not on file  . Highest education level: Not on file  Occupational History  . Not on file  Tobacco Use  . Smoking status: Never  . Smokeless tobacco: Never  Vaping Use  . Vaping status: Never Used  Substance and Sexual Activity  . Alcohol use: No    Alcohol/week: 0.0 standard drinks of alcohol  . Drug use: No  . Sexual activity: Yes  Other Topics Concern  . Not on file  Social History Narrative  . Not on file   Social Drivers of Health   Financial Resource Strain: Low Risk  (11/05/2021)   Overall Financial Resource Strain (CARDIA)   . Difficulty of Paying Living Expenses: Not hard at all  Food Insecurity: No Food Insecurity (11/05/2021)   Hunger Vital Sign   . Worried About Programme researcher, broadcasting/film/video in the Last Year: Never true   . Ran Out of Food in the Last Year: Never true  Transportation Needs: No Transportation Needs (11/05/2021)   PRAPARE - Transportation   . Lack of Transportation (Medical): No   . Lack of Transportation (Non-Medical): No  Physical Activity: Inactive (11/05/2021)   Exercise Vital Sign   . Days of Exercise per Week: 0 days   . Minutes of Exercise per Session: 0 min  Stress: No Stress Concern Present (11/05/2021)   [employer] of Occupational Health - Occupational Stress Questionnaire   . Feeling of Stress : Not at all  Social Connections: Moderately Isolated (11/05/2021)   Social Connection and Isolation Panel [NHANES]   . Frequency of Communication with Friends and Family: Three times a week   . Frequency of Social Gatherings with Friends and Family: Twice a week   . Attends Religious Services: Never   . Active Member of Clubs or Organizations: No   . Attends Banker Meetings: Never   . Marital Status: Married  Careers information officer Violence: Not At Risk (11/05/2021)   Humiliation, Afraid, Rape, and Kick questionnaire   . Fear of Current or Ex-Partner: No   . Emotionally Abused: No   . Physically Abused: No   . Sexually Abused: No   Social History   Tobacco Use  Smoking Status Never  Smokeless Tobacco Never   Social History  Substance and Sexual Activity  Alcohol Use No  . Alcohol/week: 0.0 standard drinks of alcohol    Family History:  Family History  Problem Relation Age of Onset  . Heart disease Mother        CHF  . Heart disease Father   . Cancer Father        lung  . Heart disease Maternal Grandmother        CHF  . Cancer Other        colon    Past medical history, surgical history, medications, allergies, family history and social history reviewed with patient today and changes made to appropriate areas of the chart.   Review of Systems  Constitutional:  Positive for malaise/fatigue.  Eyes:  Negative for blurred vision and double vision.  Respiratory:  Negative for shortness of breath.   Cardiovascular:  Negative for chest pain, palpitations and leg swelling.  Musculoskeletal:  Positive for myalgias.  Neurological:  Negative for dizziness and headaches.    All other ROS negative except what is listed above and in the HPI.      Objective:    LMP  (LMP Unknown)   Wt Readings from Last 3 Encounters:  07/29/22 184 lb 12.8 oz (83.8 kg)  09/10/21 192 lb 9.6 oz (87.4 kg)  03/13/21 181 lb 9.6 oz (82.4 kg)    No results found.  Physical Exam Vitals and nursing note reviewed.  Constitutional:      General: She is awake. She is not in acute distress.    Appearance: She is well-developed. She is not ill-appearing.  HENT:     Head: Normocephalic and atraumatic.     Right Ear: Hearing, tympanic membrane, ear canal and external ear normal. No drainage.     Left Ear: Hearing, tympanic membrane, ear canal and external ear normal. No drainage.     Nose: Nose normal.     Right  Sinus: No maxillary sinus tenderness or frontal sinus tenderness.     Left Sinus: No maxillary sinus tenderness or frontal sinus tenderness.     Mouth/Throat:     Mouth: Mucous membranes are moist.     Pharynx: Oropharynx is clear. Uvula midline. No pharyngeal swelling, oropharyngeal exudate or posterior oropharyngeal erythema.  Eyes:     General: Lids are normal.        Right eye: No discharge.        Left eye: No discharge.     Extraocular Movements: Extraocular movements intact.     Conjunctiva/sclera: Conjunctivae normal.     Pupils: Pupils are equal, round, and reactive to light.     Visual Fields: Right eye visual fields normal and left eye visual fields normal.  Neck:     Thyroid: No thyromegaly.     Vascular: No carotid bruit.     Trachea: Trachea normal.  Cardiovascular:     Rate and Rhythm: Normal rate and regular rhythm.     Heart sounds: Normal heart sounds. No murmur heard.    No gallop.  Pulmonary:     Effort: Pulmonary effort is normal. No accessory muscle usage or respiratory distress.     Breath sounds: Normal breath sounds.  Chest:  Breasts:    Right: Normal.     Left: Normal.  Abdominal:     General: Bowel sounds are normal.     Palpations: Abdomen is soft. There is no hepatomegaly or splenomegaly.     Tenderness: There is no abdominal tenderness.  Musculoskeletal:  General: Normal range of motion.     Cervical back: Normal range of motion and neck supple.     Right lower leg: No edema.     Left lower leg: No edema.  Lymphadenopathy:     Head:     Right side of head: No submental, submandibular, tonsillar, preauricular or posterior auricular adenopathy.     Left side of head: No submental, submandibular, tonsillar, preauricular or posterior auricular adenopathy.     Cervical: No cervical adenopathy.     Upper Body:     Right upper body: No supraclavicular, axillary or pectoral adenopathy.     Left upper body: No supraclavicular, axillary or  pectoral adenopathy.  Skin:    General: Skin is warm and dry.     Capillary Refill: Capillary refill takes less than 2 seconds.     Findings: No rash.  Neurological:     Mental Status: She is alert and oriented to person, place, and time.     Gait: Gait is intact.     Deep Tendon Reflexes: Reflexes are normal and symmetric.     Reflex Scores:      Brachioradialis reflexes are 2+ on the right side and 2+ on the left side.      Patellar reflexes are 2+ on the right side and 2+ on the left side. Psychiatric:        Attention and Perception: Attention normal.        Mood and Affect: Mood normal.        Speech: Speech normal.        Behavior: Behavior normal. Behavior is cooperative.        Thought Content: Thought content normal.        Judgment: Judgment normal.       11/05/2021    1:13 PM  6CIT Screen  What Year? 0 points  What month? 0 points  What time? 0 points  Count back from 20 0 points  Months in reverse 0 points  Repeat phrase 0 points  Total Score 0 points    Cognitive Testing - 6-CIT  Correct? Score   What year is it? yes 0 Yes = 0    No = 4  What month is it? yes 0 Yes = 0    No = 3  Remember:     [place] [address], [place]     What time is it? yes 0 Yes = 0    No = 3  Count backwards from 20 to 1 yes 0 Correct = 0    1 error = 2   More than 1 error = 4  Say the months of the year in reverse. yes 0 Correct = 0    1 error = 2   More than 1 error = 4  What address did I ask you to remember? yes 0 Correct = 0  1 error = 2    2 error = 4    3 error = 6    4 error = 8    All wrong = 10       TOTAL SCORE  0/28   Interpretation:  Normal  Normal (0-7) Abnormal (8-28)   Results for orders placed or performed in visit on 07/29/22  Comp Met (CMET)   Collection Time: 07/29/22  3:05 PM  Result Value Ref Range   Glucose 147 (H) 70 - 99 mg/dL   BUN 20 8 - 27 mg/dL  Creatinine, Ser 0.62 0.57 - 1.00 mg/dL   eGFR 98 >32 TF/TDD/2.20   BUN/Creatinine Ratio 32  (H) 12 - 28   Sodium 141 134 - 144 mmol/L   Potassium 3.9 3.5 - 5.2 mmol/L   Chloride 102 96 - 106 mmol/L   CO2 22 20 - 29 mmol/L   Calcium 9.2 8.7 - 10.3 mg/dL   Total Protein 6.6 6.0 - 8.5 g/dL   Albumin 4.2 3.9 - 4.9 g/dL   Globulin, Total 2.4 1.5 - 4.5 g/dL   Albumin/Globulin Ratio 1.8 1.2 - 2.2   Bilirubin Total 0.2 0.0 - 1.2 mg/dL   Alkaline Phosphatase 100 44 - 121 IU/L   AST 14 0 - 40 IU/L   ALT 11 0 - 32 IU/L  Lipid Profile   Collection Time: 07/29/22  3:05 PM  Result Value Ref Range   Cholesterol, Total 194 100 - 199 mg/dL   Triglycerides 254 (H) 0 - 149 mg/dL   HDL 38 (L) >27 mg/dL   VLDL Cholesterol Cal 67 (H) 5 - 40 mg/dL   LDL Chol Calc (NIH) 89 0 - 99 mg/dL   Chol/HDL Ratio 5.1 (H) 0.0 - 4.4 ratio      Assessment & Plan:   Problem List Items Addressed This Visit       Cardiovascular and Mediastinum   Essential hypertension - Primary     Other   Obesity   Anxiety   Hyperlipidemia     Preventative Services:  AAA screening: NA Health Risk Assessment and Personalized Prevention Plan: Up to date Bone Mass Measurements: Ordered Dexa Breast Cancer Screening: Ordered Mammogram CVD Screening: Ordered Cervical Cancer Screening: Due 2023 Colon Cancer Screening: Up to date Depression Screening: Ordered today Diabetes Screening: NA Glaucoma Screening: NA Hepatitis B vaccine: NA Hepatitis C screening: Up to date HIV Screening: Up to date Flu Vaccine: postponed till flu season Lung cancer Screening: NA Obesity Screening: Up to Date Pneumonia Vaccines (2): Refused STI Screening: Denies  Follow up plan: No follow-ups on file.   LABORATORY TESTING:  - Pap smear: up to date  IMMUNIZATIONS:   - Tdap: Tetanus vaccination status reviewed: last tetanus booster within 10 years. - Influenza: Postponed to flu season - Pneumovax: Refused - Prevnar: Not applicable - Zostavax vaccine: Refused  SCREENING: -Mammogram: Ordered today  - Colonoscopy: Up to date   - Bone Density: Ordered today  -Hearing Test: Up to date  -Spirometry: Not applicable   PATIENT COUNSELING:   Advised to take 1 mg of folate supplement per day if capable of pregnancy.   Sexuality: Discussed sexually transmitted diseases, partner selection, use of condoms, avoidance of unintended pregnancy  and contraceptive alternatives.   Advised to avoid cigarette smoking.  I discussed with the patient that most people either abstain from alcohol or drink within safe limits (<=14/week and <=4 drinks/occasion for males, <=7/weeks and <= 3 drinks/occasion for females) and that the risk for alcohol disorders and other health effects rises proportionally with the number of drinks per week and how often a drinker exceeds daily limits.  Discussed cessation/primary prevention of drug use and availability of treatment for abuse.   Diet: Encouraged to adjust caloric intake to maintain  or achieve ideal body weight, to reduce intake of dietary saturated fat and total fat, to limit sodium intake by avoiding high sodium foods and not adding table salt, and to maintain adequate dietary potassium and calcium preferably from fresh fruits, vegetables, and low-fat dairy products.    stressed  the importance of regular exercise  Injury prevention: Discussed safety belts, safety helmets, smoke detector, smoking near bedding or upholstery.   Dental health: Discussed importance of regular tooth brushing, flossing, and dental visits.    NEXT PREVENTATIVE PHYSICAL DUE IN 1 YEAR. No follow-ups on file.

## 2023-03-31 ENCOUNTER — Encounter: Payer: Self-pay | Admitting: Nurse Practitioner

## 2023-03-31 ENCOUNTER — Ambulatory Visit: Payer: Medicare Other | Admitting: Nurse Practitioner

## 2023-03-31 VITALS — BP 127/79 | HR 89 | Ht 59.0 in | Wt 188.4 lb

## 2023-03-31 DIAGNOSIS — E66812 Obesity, class 2: Secondary | ICD-10-CM

## 2023-03-31 DIAGNOSIS — E782 Mixed hyperlipidemia: Secondary | ICD-10-CM

## 2023-03-31 DIAGNOSIS — Z0000 Encounter for general adult medical examination without abnormal findings: Secondary | ICD-10-CM | POA: Diagnosis not present

## 2023-03-31 DIAGNOSIS — F419 Anxiety disorder, unspecified: Secondary | ICD-10-CM

## 2023-03-31 DIAGNOSIS — I10 Essential (primary) hypertension: Secondary | ICD-10-CM

## 2023-03-31 DIAGNOSIS — Z6836 Body mass index (BMI) 36.0-36.9, adult: Secondary | ICD-10-CM

## 2023-03-31 DIAGNOSIS — Z780 Asymptomatic menopausal state: Secondary | ICD-10-CM

## 2023-03-31 DIAGNOSIS — R7309 Other abnormal glucose: Secondary | ICD-10-CM

## 2023-03-31 MED ORDER — EZETIMIBE 10 MG PO TABS: ORAL_TABLET | ORAL | 1 refills | Status: DC

## 2023-03-31 MED ORDER — LOSARTAN POTASSIUM 25 MG PO TABS: 25.0000 mg | ORAL_TABLET | Freq: Every day | ORAL | 1 refills | Status: DC

## 2023-03-31 MED ORDER — SERTRALINE HCL 25 MG PO TABS: 25.0000 mg | ORAL_TABLET | Freq: Every day | ORAL | 1 refills | Status: DC

## 2023-03-31 NOTE — Patient Instructions (Signed)
 Please call to schedule your mammogram and/or bone density: [facility]  Address: [address], [place], [place] [address] Phone: [phone]   Imaging at [facility] [address] [place],  [place]  [address] Phone: [phone]

## 2023-03-31 NOTE — Assessment & Plan Note (Signed)
Chronic.  Controlled. Continue with current medication regimen of Zoloft daily.  Labs ordered today.  Return to clinic in 6 months for reevaluation.  Call sooner if concerns arise.   

## 2023-03-31 NOTE — Progress Notes (Signed)
BP 127/79 (BP Location: Left Arm, Patient Position: Sitting, Cuff Size: Large)   Pulse 89   Ht 4\' 11"  (1.499 m)   Wt 188 lb 6.4 oz (85.5 kg)   LMP  (LMP Unknown)   SpO2 97%   BMI 38.05 kg/m    Subjective:    Patient ID: Beth James, female    DOB: May 05, 1955, 68 y.o.   MRN: [id number]  HPI: Beth James is a 68 y.o. female presenting on 03/31/2023 for comprehensive medical examination. Current medical complaints include:none  She currently lives with: Husband Menopausal Symptoms: no  HYPERTENSION / HYPERLIPIDEMIA Satisfied with current treatment? yes Duration of hypertension: years BP monitoring frequency: not checking BP range:  BP medication side effects: no Past BP meds: none Duration of hyperlipidemia: years Cholesterol medication side effects: no Cholesterol supplements: red yeast rice Past cholesterol medications: Zetia  Medication compliance: excellent compliance Aspirin: no Recent stressors: no Recurrent headaches: no Visual changes: no Palpitations: no Dyspnea: no Chest pain: no Lower extremity edema: no Dizzy/lightheaded: no   Feels like the Zoloft is working well for her anxiety.   Functional Status Survey: Is the patient deaf or have difficulty hearing?: No Does the patient have difficulty seeing, even when wearing glasses/contacts?: No Does the patient have difficulty concentrating, remembering, or making decisions?: No Does the patient have difficulty walking or climbing stairs?: No Does the patient have difficulty dressing or bathing?: No Does the patient have difficulty doing errands alone such as visiting a doctor's office or shopping?: No      07/29/2022    3:00 PM 09/10/2021    9:00 AM 10/18/2020    3:54 PM 07/14/2020   11:12 AM 01/31/2020    1:23 PM  Fall Risk   Falls in the past year? 0 0 0 0 0  Number falls in past yr: 0 0 0 0 0  Injury with Fall? 0 0 0 0 0  Risk for fall due to : No Fall Risks No Fall Risks No Fall Risks No  Fall Risks   Follow up Falls evaluation completed Falls evaluation completed Falls evaluation completed Falls evaluation completed Falls evaluation completed    Depression Screen    07/29/2022    3:00 PM 11/05/2021    1:05 PM 09/10/2021    9:01 AM 03/13/2021    9:02 AM 10/18/2020    3:54 PM  Depression screen PHQ 2/9  Decreased Interest 0 0 0 0 0  Down, Depressed, Hopeless 0 0 0 0 0  PHQ - 2 Score 0 0 0 0 0  Altered sleeping 3 0 0 0 0  Tired, decreased energy 3 0 0 1 0  Change in appetite 0 0 0 0 0  Feeling bad or failure about yourself  0 0 0 0 0  Trouble concentrating 0 0 0 0 0  Moving slowly or fidgety/restless 0 0 0 0 0  Suicidal thoughts 0 0 0 0 0  PHQ-9 Score 6 0 0 1 0  Difficult doing work/chores Not difficult at all Not difficult at all Not difficult at all  Not difficult at all     Advanced Directives Does patient have a HCPOA?    no If yes, name and contact information:  Does patient have a living will or MOST form?  no  Past Medical History:  Past Medical History:  Diagnosis Date   Allergy    Anxiety    Depression    GERD (gastroesophageal reflux disease)  Hyperlipidemia    Hypertension     Surgical History:  Past Surgical History:  Procedure Laterality Date   bone spur removal     left shoulder   cartilage removal     left knee   Clogged Tear Duct Stint placed      Medications:  Current Outpatient Medications on File Prior to Visit  Medication Sig   Multiple Vitamin (MULTIVITAMIN) tablet Take 1 tablet by mouth daily.   naproxen sodium (ALEVE) 220 MG tablet Take 220 mg by mouth at bedtime. Take 2 at bedtime for arthritis   No current facility-administered medications on file prior to visit.    Allergies:  Allergies  Allergen Reactions   Amlodipine Other (See Comments)    jittery   Codeine Other (See Comments)    jittery   Lipitor [Atorvastatin] Other (See Comments)    Myalgias   Lisinopril Other (See Comments)    myalgias   Tape Rash     Needs to use paper tape    Social History:  Social History   Socioeconomic History   Marital status: Married    Spouse name: Not on file   Number of children: Not on file   Years of education: Not on file   Highest education level: Not on file  Occupational History   Not on file  Tobacco Use   Smoking status: Never   Smokeless tobacco: Never  Vaping Use   Vaping status: Never Used  Substance and Sexual Activity   Alcohol use: No    Alcohol/week: 0.0 standard drinks of alcohol   Drug use: No   Sexual activity: Yes  Other Topics Concern   Not on file  Social History Narrative   Not on file   Social Drivers of Health   Financial Resource Strain: Low Risk  (11/05/2021)   Overall Financial Resource Strain (CARDIA)    Difficulty of Paying Living Expenses: Not hard at all  Food Insecurity: No Food Insecurity (11/05/2021)   Hunger Vital Sign    Worried About Running Out of Food in the Last Year: Never true    Ran Out of Food in the Last Year: Never true  Transportation Needs: No Transportation Needs (11/05/2021)   PRAPARE - Administrator, Civil Service (Medical): No    Lack of Transportation (Non-Medical): No  Physical Activity: Inactive (11/05/2021)   Exercise Vital Sign    Days of Exercise per Week: 0 days    Minutes of Exercise per Session: 0 min  Stress: No Stress Concern Present (11/05/2021)   [employer] of Occupational Health - Occupational Stress Questionnaire    Feeling of Stress : Not at all  Social Connections: Moderately Isolated (11/05/2021)   Social Connection and Isolation Panel [NHANES]    Frequency of Communication with Friends and Family: Three times a week    Frequency of Social Gatherings with Friends and Family: Twice a week    Attends Religious Services: Never    Database administrator or Organizations: No    Attends Banker Meetings: Never    Marital Status: Married  Catering manager Violence: Not At Risk (11/05/2021)    Humiliation, Afraid, Rape, and Kick questionnaire    Fear of Current or Ex-Partner: No    Emotionally Abused: No    Physically Abused: No    Sexually Abused: No   Social History   Tobacco Use  Smoking Status Never  Smokeless Tobacco Never   Social History   Substance and  Sexual Activity  Alcohol Use No   Alcohol/week: 0.0 standard drinks of alcohol    Family History:  Family History  Problem Relation Age of Onset   Heart disease Mother        CHF   Heart disease Father    Cancer Father        lung   Heart disease Maternal Grandmother        CHF   Cancer Other        colon    Past medical history, surgical history, medications, allergies, family history and social history reviewed with patient today and changes made to appropriate areas of the chart.   Review of Systems  Eyes:  Negative for blurred vision and double vision.  Respiratory:  Negative for shortness of breath.   Cardiovascular:  Negative for chest pain, palpitations and leg swelling.  Neurological:  Negative for dizziness and headaches.  Psychiatric/Behavioral:  The patient is nervous/anxious.     All other ROS negative except what is listed above and in the HPI.      Objective:    BP 127/79 (BP Location: Left Arm, Patient Position: Sitting, Cuff Size: Large)   Pulse 89   Ht 4\' 11"  (1.499 m)   Wt 188 lb 6.4 oz (85.5 kg)   LMP  (LMP Unknown)   SpO2 97%   BMI 38.05 kg/m   Wt Readings from Last 3 Encounters:  03/31/23 188 lb 6.4 oz (85.5 kg)  07/29/22 184 lb 12.8 oz (83.8 kg)  09/10/21 192 lb 9.6 oz (87.4 kg)    No results found.  Physical Exam Vitals and nursing note reviewed.  Constitutional:      General: She is awake. She is not in acute distress.    Appearance: Normal appearance. She is well-developed. She is obese. She is not ill-appearing.  HENT:     Head: Normocephalic and atraumatic.     Right Ear: Hearing, tympanic membrane, ear canal and external ear normal. No drainage.      Left Ear: Hearing, tympanic membrane, ear canal and external ear normal. No drainage.     Nose: Nose normal.     Right Sinus: No maxillary sinus tenderness or frontal sinus tenderness.     Left Sinus: No maxillary sinus tenderness or frontal sinus tenderness.     Mouth/Throat:     Mouth: Mucous membranes are moist.     Pharynx: Oropharynx is clear. Uvula midline. No pharyngeal swelling, oropharyngeal exudate or posterior oropharyngeal erythema.  Eyes:     General: Lids are normal.        Right eye: No discharge.        Left eye: No discharge.     Extraocular Movements: Extraocular movements intact.     Conjunctiva/sclera: Conjunctivae normal.     Pupils: Pupils are equal, round, and reactive to light.     Visual Fields: Right eye visual fields normal and left eye visual fields normal.  Neck:     Thyroid: No thyromegaly.     Vascular: No carotid bruit.     Trachea: Trachea normal.  Cardiovascular:     Rate and Rhythm: Normal rate and regular rhythm.     Heart sounds: Normal heart sounds. No murmur heard.    No gallop.  Pulmonary:     Effort: Pulmonary effort is normal. No accessory muscle usage or respiratory distress.     Breath sounds: Normal breath sounds.  Chest:  Breasts:    Right: Normal.  Left: Normal.  Abdominal:     General: Bowel sounds are normal.     Palpations: Abdomen is soft. There is no hepatomegaly or splenomegaly.     Tenderness: There is no abdominal tenderness.  Musculoskeletal:        General: Normal range of motion.     Cervical back: Normal range of motion and neck supple.     Right lower leg: No edema.     Left lower leg: No edema.  Lymphadenopathy:     Head:     Right side of head: No submental, submandibular, tonsillar, preauricular or posterior auricular adenopathy.     Left side of head: No submental, submandibular, tonsillar, preauricular or posterior auricular adenopathy.     Cervical: No cervical adenopathy.     Upper Body:     Right upper  body: No supraclavicular, axillary or pectoral adenopathy.     Left upper body: No supraclavicular, axillary or pectoral adenopathy.  Skin:    General: Skin is warm and dry.     Capillary Refill: Capillary refill takes less than 2 seconds.     Findings: No rash.  Neurological:     Mental Status: She is alert and oriented to person, place, and time.     Gait: Gait is intact.     Deep Tendon Reflexes: Reflexes are normal and symmetric.     Reflex Scores:      Brachioradialis reflexes are 2+ on the right side and 2+ on the left side.      Patellar reflexes are 2+ on the right side and 2+ on the left side. Psychiatric:        Attention and Perception: Attention normal.        Mood and Affect: Mood normal.        Speech: Speech normal.        Behavior: Behavior normal. Behavior is cooperative.        Thought Content: Thought content normal.        Judgment: Judgment normal.        03/31/2023    3:29 PM 11/05/2021    1:13 PM  6CIT Screen  What Year? 0 points 0 points  What month? 0 points 0 points  What time? 0 points 0 points  Count back from 20 0 points 0 points  Months in reverse 0 points 0 points  Repeat phrase 6 points 0 points  Total Score 6 points 0 points      Results for orders placed or performed in visit on 07/29/22  Comp Met (CMET)   Collection Time: 07/29/22  3:05 PM  Result Value Ref Range   Glucose 147 (H) 70 - 99 mg/dL   BUN 20 8 - 27 mg/dL   Creatinine, Ser 8.29 0.57 - 1.00 mg/dL   eGFR 98 >56 OZ/HYQ/6.57   BUN/Creatinine Ratio 32 (H) 12 - 28   Sodium 141 134 - 144 mmol/L   Potassium 3.9 3.5 - 5.2 mmol/L   Chloride 102 96 - 106 mmol/L   CO2 22 20 - 29 mmol/L   Calcium 9.2 8.7 - 10.3 mg/dL   Total Protein 6.6 6.0 - 8.5 g/dL   Albumin 4.2 3.9 - 4.9 g/dL   Globulin, Total 2.4 1.5 - 4.5 g/dL   Albumin/Globulin Ratio 1.8 1.2 - 2.2   Bilirubin Total 0.2 0.0 - 1.2 mg/dL   Alkaline Phosphatase 100 44 - 121 IU/L   AST 14 0 - 40 IU/L   ALT 11 0 -  32 IU/L   Lipid Profile   Collection Time: 07/29/22  3:05 PM  Result Value Ref Range   Cholesterol, Total 194 100 - 199 mg/dL   Triglycerides 573 (H) 0 - 149 mg/dL   HDL 38 (L) >22 mg/dL   VLDL Cholesterol Cal 67 (H) 5 - 40 mg/dL   LDL Chol Calc (NIH) 89 0 - 99 mg/dL   Chol/HDL Ratio 5.1 (H) 0.0 - 4.4 ratio      Assessment & Plan:   Problem List Items Addressed This Visit       Cardiovascular and Mediastinum   Essential hypertension   Chronic.  Controlled.  Continue with current medication regimen of Losartan daily.  Labs ordered today.  Return to clinic in 6 months for reevaluation.  Call sooner if concerns arise.        Relevant Medications   losartan (COZAAR) 25 MG tablet   ezetimibe (ZETIA) 10 MG tablet   Other Relevant Orders   Comprehensive metabolic panel     Other   Obesity   Recommended eating smaller high protein, low fat meals more frequently and exercising 30 mins a day 5 times a week with a goal of 10-15lb weight loss in the next 3 months.       Anxiety   Chronic.  Controlled.  Continue with current medication regimen of Zoloft daily.  Labs ordered today.  Return to clinic in 6 months for reevaluation.  Call sooner if concerns arise.        Relevant Medications   sertraline (ZOLOFT) 25 MG tablet   Hyperlipidemia   Chronic.  Controlled.  Continue with current medication regimen of Zetia.  Not able to tolerate statins.  Labs ordered today.  Return to clinic in 6 months for reevaluation.  Call sooner if concerns arise.        Relevant Medications   losartan (COZAAR) 25 MG tablet   ezetimibe (ZETIA) 10 MG tablet   Other Relevant Orders   Lipid panel   Other Visit Diagnoses       Encounter for annual wellness exam in Medicare patient    -  Primary     Elevated glucose       Relevant Orders   Hemoglobin A1c     Post-menopausal       Relevant Orders   DG Bone Density         Preventative Services:  AAA screening: NA Health Risk Assessment and  Personalized Prevention Plan: Up to date Bone Mass Measurements: Ordered Dexa Breast Cancer Screening: up to date CVD Screening: Ordered Cervical Cancer Screening: Up to date Colon Cancer Screening: Up to date Depression Screening: Ordered today Diabetes Screening: NA Glaucoma Screening: NA Hepatitis B vaccine: NA Hepatitis C screening: Up to date HIV Screening: Up to date Flu Vaccine: Refused Lung cancer Screening: NA Obesity Screening: Up to Date Pneumonia Vaccines (2): Refused STI Screening: Denies  Follow up plan: Return in about 6 months (around 09/28/2023) for HTN, HLD, DM2 FU.   LABORATORY TESTING:  - Pap smear: up to date  IMMUNIZATIONS:   - Tdap: Tetanus vaccination status reviewed: last tetanus booster within 10 years. - Influenza: Postponed to flu season - Pneumovax: Refused - Prevnar: Not applicable - Zostavax vaccine: Refused  SCREENING: -Mammogram: Ordered today  - Colonoscopy: Up to date  - Bone Density: Ordered today  -Hearing Test: Up to date  -Spirometry: Not applicable   PATIENT COUNSELING:   Advised to take 1 mg of folate supplement per  day if capable of pregnancy.   Sexuality: Discussed sexually transmitted diseases, partner selection, use of condoms, avoidance of unintended pregnancy  and contraceptive alternatives.   Advised to avoid cigarette smoking.  I discussed with the patient that most people either abstain from alcohol or drink within safe limits (<=14/week and <=4 drinks/occasion for males, <=7/weeks and <= 3 drinks/occasion for females) and that the risk for alcohol disorders and other health effects rises proportionally with the number of drinks per week and how often a drinker exceeds daily limits.  Discussed cessation/primary prevention of drug use and availability of treatment for abuse.   Diet: Encouraged to adjust caloric intake to maintain  or achieve ideal body weight, to reduce intake of dietary saturated fat and total fat, to  limit sodium intake by avoiding high sodium foods and not adding table salt, and to maintain adequate dietary potassium and calcium preferably from fresh fruits, vegetables, and low-fat dairy products.    stressed the importance of regular exercise  Injury prevention: Discussed safety belts, safety helmets, smoke detector, smoking near bedding or upholstery.   Dental health: Discussed importance of regular tooth brushing, flossing, and dental visits.    NEXT PREVENTATIVE PHYSICAL DUE IN 1 YEAR. Return in about 6 months (around 09/28/2023) for HTN, HLD, DM2 FU.

## 2023-03-31 NOTE — Assessment & Plan Note (Signed)
 Recommended eating smaller high protein, low fat meals more frequently and exercising 30 mins a day 5 times a week with a goal of 10-15lb weight loss in the next 3 months.

## 2023-03-31 NOTE — Assessment & Plan Note (Signed)
Chronic.  Controlled.  Continue with current medication regimen of Losartan daily.  Labs ordered today.  Return to clinic in 6 months for reevaluation.  Call sooner if concerns arise.

## 2023-03-31 NOTE — Assessment & Plan Note (Signed)
Chronic.  Controlled.  Continue with current medication regimen of Zetia.  Not able to tolerate statins.  Labs ordered today.  Return to clinic in 6 months for reevaluation.  Call sooner if concerns arise.

## 2023-04-01 ENCOUNTER — Encounter: Payer: Self-pay | Admitting: Nurse Practitioner

## 2023-04-01 LAB — COMPREHENSIVE METABOLIC PANEL
ALT: 10 [IU]/L (ref 0–32)
AST: 15 [IU]/L (ref 0–40)
Albumin: 4.2 g/dL (ref 3.9–4.9)
Alkaline Phosphatase: 97 [IU]/L (ref 44–121)
BUN/Creatinine Ratio: 29 — ABNORMAL HIGH (ref 12–28)
BUN: 19 mg/dL (ref 8–27)
Bilirubin Total: <0.2 mg/dL (ref 0.0–1.2)
CO2: 20 mmol/L (ref 20–29)
Calcium: 9.8 mg/dL (ref 8.7–10.3)
Chloride: 102 mmol/L (ref 96–106)
Creatinine, Ser: 0.66 mg/dL (ref 0.57–1.00)
Globulin, Total: 2.1 g/dL (ref 1.5–4.5)
Glucose: 99 mg/dL (ref 70–99)
Potassium: 4.3 mmol/L (ref 3.5–5.2)
Sodium: 140 mmol/L (ref 134–144)
Total Protein: 6.3 g/dL (ref 6.0–8.5)
eGFR: 96 mL/min/{1.73_m2} (ref >59)

## 2023-04-01 LAB — LIPID PANEL
Chol/HDL Ratio: 5 {ratio} — ABNORMAL HIGH (ref 0.0–4.4)
Cholesterol, Total: 194 mg/dL (ref 100–199)
HDL: 39 mg/dL — ABNORMAL LOW (ref >39)
LDL Chol Calc (NIH): 93 mg/dL (ref 0–99)
Triglycerides: 374 mg/dL — ABNORMAL HIGH (ref 0–149)
VLDL Cholesterol Cal: 62 mg/dL — ABNORMAL HIGH (ref 5–40)

## 2023-04-01 LAB — HEMOGLOBIN A1C
Est. average glucose Bld gHb Est-mCnc: 123 mg/dL
Hgb A1c MFr Bld: 5.9 % — ABNORMAL HIGH (ref 4.8–5.6)

## 2023-05-05 ENCOUNTER — Other Ambulatory Visit: Payer: Self-pay | Admitting: Nurse Practitioner

## 2023-05-06 NOTE — Telephone Encounter (Signed)
 Last Rx sent to [facility]- 03/31/23- attempted to call patient to verify pharmacy- left message to call back.

## 2023-05-06 NOTE — Telephone Encounter (Signed)
 Rx sent to [facility] pharmacy 03/31/23 #90/1 RF  Requested Prescriptions  Pending Prescriptions Disp Refills   sertraline (ZOLOFT) 25 MG tablet [Pharmacy Med Name: SERTRALINE 25 MG TAB] 90 tablet 1    Sig: TAKE ONE TABLET BY MOUTH ONE TIME DAILY     Psychiatry:  Antidepressants - SSRI - sertraline Failed - 05/06/2023  4:41 PM      Failed - Valid encounter within last 6 months    Recent Outpatient Visits           9 months ago Mixed hyperlipidemia   [place] [facility] Larae Grooms, NP   1 year ago Essential hypertension   [place] [facility] Larae Grooms, NP   2 years ago Essential hypertension   [place] 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

## 2023-08-05 ENCOUNTER — Ambulatory Visit

## 2023-09-29 ENCOUNTER — Ambulatory Visit (INDEPENDENT_AMBULATORY_CARE_PROVIDER_SITE_OTHER): Payer: Medicare Other | Admitting: Nurse Practitioner

## 2023-09-29 ENCOUNTER — Encounter: Payer: Self-pay | Admitting: Nurse Practitioner

## 2023-09-29 VITALS — BP 126/84 | HR 73 | Temp 97.6°F | Ht 59.0 in | Wt 193.4 lb

## 2023-09-29 DIAGNOSIS — R7303 Prediabetes: Secondary | ICD-10-CM

## 2023-09-29 DIAGNOSIS — E782 Mixed hyperlipidemia: Secondary | ICD-10-CM

## 2023-09-29 DIAGNOSIS — F419 Anxiety disorder, unspecified: Secondary | ICD-10-CM | POA: Diagnosis not present

## 2023-09-29 DIAGNOSIS — Z1231 Encounter for screening mammogram for malignant neoplasm of breast: Secondary | ICD-10-CM

## 2023-09-29 DIAGNOSIS — I10 Essential (primary) hypertension: Secondary | ICD-10-CM

## 2023-09-29 DIAGNOSIS — Z1211 Encounter for screening for malignant neoplasm of colon: Secondary | ICD-10-CM

## 2023-09-29 DIAGNOSIS — E66812 Obesity, class 2: Secondary | ICD-10-CM

## 2023-09-29 DIAGNOSIS — Z6836 Body mass index (BMI) 36.0-36.9, adult: Secondary | ICD-10-CM

## 2023-09-29 DIAGNOSIS — Z780 Asymptomatic menopausal state: Secondary | ICD-10-CM

## 2023-09-29 MED ORDER — EZETIMIBE 10 MG PO TABS: ORAL_TABLET | ORAL | 1 refills | Status: AC

## 2023-09-29 MED ORDER — LOSARTAN POTASSIUM 25 MG PO TABS: 25.0000 mg | ORAL_TABLET | Freq: Every day | ORAL | 1 refills | Status: DC

## 2023-09-29 MED ORDER — SERTRALINE HCL 25 MG PO TABS: 25.0000 mg | ORAL_TABLET | Freq: Every day | ORAL | 1 refills | Status: DC

## 2023-09-29 NOTE — Progress Notes (Signed)
 BP 126/84 (BP Location: Left Arm, Cuff Size: Large)   Pulse 73   Temp 97.6 F (36.4 C) (Oral)   Ht 4' 11 (1.499 m)   Wt 193 lb 6.4 oz (87.7 kg)   LMP  (LMP Unknown)   SpO2 98%   BMI 39.06 kg/m    Subjective:    Patient ID: Beth James, female    DOB: 1955/09/14, 69 y.o.   MRN: [id number]  HPI: Beth James is a 68 y.o. female  Chief Complaint  Patient presents with   Hyperlipidemia   Hypertension   Diabetes   HYPERTENSION Hypertension status: controlled  Satisfied with current treatment? yes Duration of hypertension: years BP monitoring frequency:   BP range:  BP medication side effects:  no Medication compliance: excellent compliance Previous BP meds: Aspirin: no Recurrent headaches: no Visual changes: no Palpitations: no Dyspnea: no Chest pain: no Lower extremity edema: no Dizzy/lightheaded: no  HYPERLIPIDEMIA Patient started Zetia  she is tolerating it well.  Denies concerns regarding medication at visit today. Does not see any side effects with the medication.  The 10-year ASCVD risk score (Arnett DK, et al., 2019) is: 10.5%   Values used to calculate the score:     Age: 26 years     Sex: Female     Is Non-Hispanic African American: No     Diabetic: No     Tobacco smoker: No     Systolic Blood Pressure: 129 mmHg     Is BP treated: Yes     HDL Cholesterol: 42 mg/dL     Total Cholesterol: 215 mg/dL  Relevant past medical, surgical, family and social history reviewed and updated as indicated. Interim medical history since our last visit reviewed. Allergies and medications reviewed and updated.  Review of Systems  Eyes:  Negative for visual disturbance.  Respiratory:  Negative for cough, chest tightness and shortness of breath.   Cardiovascular:  Negative for chest pain, palpitations and leg swelling.  Neurological:  Negative for dizziness and headaches.    Per HPI unless specifically indicated above     Objective:    BP 126/84 (BP  Location: Left Arm, Cuff Size: Large)   Pulse 73   Temp 97.6 F (36.4 C) (Oral)   Ht 4' 11 (1.499 m)   Wt 193 lb 6.4 oz (87.7 kg)   LMP  (LMP Unknown)   SpO2 98%   BMI 39.06 kg/m   Wt Readings from Last 3 Encounters:  09/29/23 193 lb 6.4 oz (87.7 kg)  03/31/23 188 lb 6.4 oz (85.5 kg)  07/29/22 184 lb 12.8 oz (83.8 kg)    Physical Exam Vitals and nursing note reviewed.  Constitutional:      General: She is not in acute distress.    Appearance: Normal appearance. She is obese. She is not ill-appearing, toxic-appearing or diaphoretic.  HENT:     Head: Normocephalic.     Right Ear: External ear normal.     Left Ear: External ear normal.     Nose: Nose normal.     Mouth/Throat:     Mouth: Mucous membranes are moist.     Pharynx: Oropharynx is clear.  Eyes:     General:        Right eye: No discharge.        Left eye: No discharge.     Extraocular Movements: Extraocular movements intact.     Conjunctiva/sclera: Conjunctivae normal.     Pupils: Pupils are equal, round, and  reactive to light.  Cardiovascular:     Rate and Rhythm: Normal rate and regular rhythm.     Heart sounds: No murmur heard. Pulmonary:     Effort: Pulmonary effort is normal. No respiratory distress.     Breath sounds: Normal breath sounds. No wheezing or rales.  Musculoskeletal:     Cervical back: Normal range of motion and neck supple.  Skin:    General: Skin is warm and dry.     Capillary Refill: Capillary refill takes less than 2 seconds.  Neurological:     General: No focal deficit present.     Mental Status: She is alert and oriented to person, place, and time. Mental status is at baseline.  Psychiatric:        Mood and Affect: Mood normal.        Behavior: Behavior normal.        Thought Content: Thought content normal.        Judgment: Judgment normal.     Results for orders placed or performed in visit on 03/31/23  Comprehensive metabolic panel   Collection Time: 03/31/23  3:42 PM   Result Value Ref Range   Glucose 99 70 - 99 mg/dL   BUN 19 8 - 27 mg/dL   Creatinine, Ser 9.33 0.57 - 1.00 mg/dL   eGFR 96 >40 fO/fpw/8.26   BUN/Creatinine Ratio 29 (H) 12 - 28   Sodium 140 134 - 144 mmol/L   Potassium 4.3 3.5 - 5.2 mmol/L   Chloride 102 96 - 106 mmol/L   CO2 20 20 - 29 mmol/L   Calcium  9.8 8.7 - 10.3 mg/dL   Total Protein 6.3 6.0 - 8.5 g/dL   Albumin 4.2 3.9 - 4.9 g/dL   Globulin, Total 2.1 1.5 - 4.5 g/dL   Bilirubin Total <9.7 0.0 - 1.2 mg/dL   Alkaline Phosphatase 97 44 - 121 IU/L   AST 15 0 - 40 IU/L   ALT 10 0 - 32 IU/L  Hemoglobin A1c   Collection Time: 03/31/23  3:42 PM  Result Value Ref Range   Hgb A1c MFr Bld 5.9 (H) 4.8 - 5.6 %   Est. average glucose Bld gHb Est-mCnc 123 mg/dL  Lipid panel   Collection Time: 03/31/23  3:42 PM  Result Value Ref Range   Cholesterol, Total 194 100 - 199 mg/dL   Triglycerides 625 (H) 0 - 149 mg/dL   HDL 39 (L) >60 mg/dL   VLDL Cholesterol Cal 62 (H) 5 - 40 mg/dL   LDL Chol Calc (NIH) 93 0 - 99 mg/dL   Chol/HDL Ratio 5.0 (H) 0.0 - 4.4 ratio      Assessment & Plan:   Problem List Items Addressed This Visit       Cardiovascular and Mediastinum   Essential hypertension   Chronic.  Controlled.  Continue with current medication regimen of Losartan  daily. Refills sent today.  Labs ordered today.  Return to clinic in 6 months for reevaluation.  Call sooner if concerns arise.       Relevant Medications   ezetimibe  (ZETIA ) 10 MG tablet   losartan  (COZAAR ) 25 MG tablet   Other Relevant Orders   Comprehensive metabolic panel with GFR     Other   Obesity - Primary   Recommended eating smaller high protein, low fat meals more frequently and exercising 30 mins a day 5 times a week with a goal of 10-15lb weight loss in the next 3 months.  Anxiety   Chronic.  Controlled.  Continue with current medication regimen of Zoloft  25mg .  Refills sent today.  Labs ordered today.  Return to clinic in 6 months for  reevaluation.  Call sooner if concerns arise.        Relevant Medications   sertraline  (ZOLOFT ) 25 MG tablet   Hyperlipidemia   Chronic.  Controlled.  Continue with current medication regimen of Zetia .  Refills sent today.  Labs ordered today.  Return to clinic in 6 months for reevaluation.  Call sooner if concerns arise.        Relevant Medications   ezetimibe  (ZETIA ) 10 MG tablet   losartan  (COZAAR ) 25 MG tablet   Other Relevant Orders   Lipid panel   Other Visit Diagnoses       Prediabetes       Relevant Orders   Hemoglobin A1c     Screening for colon cancer       Relevant Orders   Cologuard     Post-menopausal       Relevant Orders   HM DEXA SCAN (Completed)     Encounter for screening mammogram for malignant neoplasm of breast       Relevant Orders   MM 3D SCREENING MAMMOGRAM BILATERAL BREAST        Follow up plan: Return in about 6 months (around 03/31/2024) for Physical and Fasting labs.

## 2023-09-29 NOTE — Assessment & Plan Note (Signed)
Chronic.  Controlled.  Continue with current medication regimen of Losartan daily.  Refills sent today.  Labs ordered today.  Return to clinic in 6 months for reevaluation.  Call sooner if concerns arise.

## 2023-09-29 NOTE — Assessment & Plan Note (Signed)
 Chronic.  Controlled.  Continue with current medication regimen of Zoloft  25mg .  Refills sent today.  Labs ordered today.  Return to clinic in 6 months for reevaluation.  Call sooner if concerns arise.

## 2023-09-29 NOTE — Assessment & Plan Note (Signed)
 Recommended eating smaller high protein, low fat meals more frequently and exercising 30 mins a day 5 times a week with a goal of 10-15lb weight loss in the next 3 months.

## 2023-09-29 NOTE — Assessment & Plan Note (Signed)
Chronic.  Controlled.  Continue with current medication regimen of Zetia.  Refills sent today.  Labs ordered today.  Return to clinic in 6 months for reevaluation.  Call sooner if concerns arise.

## 2023-09-30 ENCOUNTER — Ambulatory Visit: Payer: Self-pay | Admitting: Nurse Practitioner

## 2023-09-30 LAB — COMPREHENSIVE METABOLIC PANEL WITH GFR
ALT: 14 IU/L (ref 0–32)
AST: 13 IU/L (ref 0–40)
Albumin: 4.2 g/dL (ref 3.9–4.9)
Alkaline Phosphatase: 102 IU/L (ref 44–121)
BUN/Creatinine Ratio: 27 (ref 12–28)
BUN: 17 mg/dL (ref 8–27)
Bilirubin Total: 0.4 mg/dL (ref 0.0–1.2)
CO2: 24 mmol/L (ref 20–29)
Calcium: 9.2 mg/dL (ref 8.7–10.3)
Chloride: 102 mmol/L (ref 96–106)
Creatinine, Ser: 0.62 mg/dL (ref 0.57–1.00)
Globulin, Total: 2.2 g/dL (ref 1.5–4.5)
Glucose: 100 mg/dL — ABNORMAL HIGH (ref 70–99)
Potassium: 4.3 mmol/L (ref 3.5–5.2)
Sodium: 140 mmol/L (ref 134–144)
Total Protein: 6.4 g/dL (ref 6.0–8.5)
eGFR: 97 mL/min/1.73 (ref >59)

## 2023-09-30 LAB — LIPID PANEL
Chol/HDL Ratio: 4.2 ratio (ref 0.0–4.4)
Cholesterol, Total: 185 mg/dL (ref 100–199)
HDL: 44 mg/dL (ref >39)
LDL Chol Calc (NIH): 104 mg/dL — ABNORMAL HIGH (ref 0–99)
Triglycerides: 212 mg/dL — ABNORMAL HIGH (ref 0–149)
VLDL Cholesterol Cal: 37 mg/dL (ref 5–40)

## 2023-09-30 LAB — HEMOGLOBIN A1C
Est. average glucose Bld gHb Est-mCnc: 120 mg/dL
Hgb A1c MFr Bld: 5.8 % — ABNORMAL HIGH (ref 4.8–5.6)

## 2023-10-12 LAB — COLOGUARD: COLOGUARD: NEGATIVE

## 2023-11-10 ENCOUNTER — Ambulatory Visit: Payer: Self-pay | Admitting: Nurse Practitioner

## 2023-11-10 ENCOUNTER — Ambulatory Visit
Admission: RE | Admit: 2023-11-10 | Discharge: 2023-11-10 | Disposition: A | Discharge status: Home / Self Care | Source: Ambulatory Visit | Attending: Nurse Practitioner | Admitting: Nurse Practitioner

## 2023-11-10 ENCOUNTER — Ambulatory Visit
Admission: RE | Admit: 2023-11-10 | Discharge: 2023-11-10 | Disposition: A | Discharge status: Home / Self Care | Source: Ambulatory Visit | Attending: Nurse Practitioner

## 2023-11-10 DIAGNOSIS — Z780 Asymptomatic menopausal state: Secondary | ICD-10-CM

## 2023-11-10 DIAGNOSIS — Z1231 Encounter for screening mammogram for malignant neoplasm of breast: Secondary | ICD-10-CM | POA: Insufficient documentation

## 2023-12-07 ENCOUNTER — Other Ambulatory Visit: Payer: Self-pay | Admitting: Nurse Practitioner

## 2023-12-09 NOTE — Telephone Encounter (Signed)
 Requested Prescriptions  Pending Prescriptions Disp Refills   sertraline  (ZOLOFT ) 25 MG tablet 90 tablet 0    Sig: Take 1 tablet by mouth daily.     Psychiatry:  Antidepressants - SSRI - sertraline  Passed - 12/09/2023  2:50 PM      Passed - AST in normal range and within 360 days    AST  Date Value Ref Range Status  09/29/2023 13 0 - 40 IU/L Final         Passed - ALT in normal range and within 360 days    ALT  Date Value Ref Range Status  09/29/2023 14 0 - 32 IU/L Final         Passed - Completed PHQ-2 or PHQ-9 in the last 360 days      Passed - Valid encounter within last 6 months    Recent Outpatient Visits           2 months ago Class 2 severe obesity due to excess calories with serious comorbidity and body mass index (BMI) of 36.0 to 36.9 in adult   [facility] Melvin Pao, NP   8 months ago Encounter for annual wellness exam in Medicare patient   [facility] Melvin Pao, NP

## 2024-03-29 ENCOUNTER — Other Ambulatory Visit: Payer: Self-pay | Admitting: Nurse Practitioner

## 2024-04-05 ENCOUNTER — Encounter: Admitting: Nurse Practitioner

## 2024-04-06 ENCOUNTER — Encounter: Admitting: Nurse Practitioner

## 2024-04-06 ENCOUNTER — Ambulatory Visit
